# Patient Record
Sex: Female | Born: 1985 | Race: Black or African American | Hispanic: No | Marital: Single | Smoking: Current every day smoker
Health system: Southern US, Community
[De-identification: ages and names within clinical notes are randomized; demographics above are authoritative.]

## PROBLEM LIST (undated history)

## (undated) ENCOUNTER — Inpatient Hospital Stay (HOSPITAL_COMMUNITY): Payer: Self-pay

## (undated) DIAGNOSIS — K219 Gastro-esophageal reflux disease without esophagitis: Secondary | ICD-10-CM

## (undated) DIAGNOSIS — IMO0002 Reserved for concepts with insufficient information to code with codable children: Secondary | ICD-10-CM

## (undated) DIAGNOSIS — R102 Pelvic and perineal pain: Secondary | ICD-10-CM

## (undated) DIAGNOSIS — A549 Gonococcal infection, unspecified: Secondary | ICD-10-CM

## (undated) DIAGNOSIS — R87619 Unspecified abnormal cytological findings in specimens from cervix uteri: Secondary | ICD-10-CM

## (undated) DIAGNOSIS — A749 Chlamydial infection, unspecified: Secondary | ICD-10-CM

## (undated) DIAGNOSIS — N7011 Chronic salpingitis: Secondary | ICD-10-CM

## (undated) DIAGNOSIS — R87629 Unspecified abnormal cytological findings in specimens from vagina: Secondary | ICD-10-CM

## (undated) DIAGNOSIS — D649 Anemia, unspecified: Secondary | ICD-10-CM

## (undated) DIAGNOSIS — I499 Cardiac arrhythmia, unspecified: Secondary | ICD-10-CM

## (undated) HISTORY — DX: Unspecified abnormal cytological findings in specimens from cervix uteri: R87.619

## (undated) HISTORY — DX: Reserved for concepts with insufficient information to code with codable children: IMO0002

---
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
## 2008-07-11 ENCOUNTER — Encounter: Payer: Self-pay | Admitting: Emergency Medicine

## 2008-07-11 ENCOUNTER — Inpatient Hospital Stay (HOSPITAL_COMMUNITY): Admission: AD | Admit: 2008-07-11 | Discharge: 2008-07-13 | Payer: Self-pay | Admitting: Obstetrics and Gynecology

## 2010-05-18 ENCOUNTER — Emergency Department (HOSPITAL_COMMUNITY)
Admission: EM | Admit: 2010-05-18 | Discharge: 2010-05-18 | Disposition: A | Discharge status: Home / Self Care | Payer: Self-pay | Attending: Emergency Medicine | Admitting: Emergency Medicine

## 2010-05-18 DIAGNOSIS — R42 Dizziness and giddiness: Secondary | ICD-10-CM | POA: Insufficient documentation

## 2010-05-18 DIAGNOSIS — R5381 Other malaise: Secondary | ICD-10-CM | POA: Insufficient documentation

## 2010-05-18 LAB — URINALYSIS, ROUTINE W REFLEX MICROSCOPIC
Glucose, UA: NEGATIVE mg/dL
Leukocytes, UA: NEGATIVE
Protein, ur: 30 mg/dL — AB
Specific Gravity, Urine: 1.029 (ref 1.005–1.030)
Urobilinogen, UA: 1 mg/dL (ref 0.0–1.0)

## 2010-05-18 LAB — URINE MICROSCOPIC-ADD ON

## 2010-06-05 LAB — DIFFERENTIAL
Eosinophils Absolute: 0 10*3/uL (ref 0.0–0.7)
Lymphocytes Relative: 9 % — ABNORMAL LOW (ref 12–46)
Lymphs Abs: 1.1 10*3/uL (ref 0.7–4.0)
Neutrophils Relative %: 84 % — ABNORMAL HIGH (ref 43–77)

## 2010-06-05 LAB — URINALYSIS, ROUTINE W REFLEX MICROSCOPIC
Bilirubin Urine: NEGATIVE
Glucose, UA: NEGATIVE mg/dL
Ketones, ur: NEGATIVE mg/dL
pH: 6 (ref 5.0–8.0)

## 2010-06-05 LAB — BASIC METABOLIC PANEL
CO2: 23 mEq/L (ref 19–32)
Calcium: 9.3 mg/dL (ref 8.4–10.5)
Chloride: 109 mEq/L (ref 96–112)
GFR calc Af Amer: >60 mL/min (ref >60)
Sodium: 139 mEq/L (ref 135–145)

## 2010-06-05 LAB — CBC
HCT: 27 % — ABNORMAL LOW (ref 36.0–46.0)
MCHC: 34 g/dL (ref 30.0–36.0)
Platelets: 242 10*3/uL (ref 150–400)
Platelets: 217 10*3/uL (ref 150–400)
RDW: 20.6 % — ABNORMAL HIGH (ref 11.5–15.5)
WBC: 12.3 10*3/uL — ABNORMAL HIGH (ref 4.0–10.5)

## 2010-06-05 LAB — URINE MICROSCOPIC-ADD ON

## 2010-06-05 LAB — WET PREP, GENITAL: Trich, Wet Prep: NONE SEEN

## 2010-06-05 LAB — POCT PREGNANCY, URINE: Preg Test, Ur: NEGATIVE

## 2010-06-05 LAB — GC/CHLAMYDIA PROBE AMP, GENITAL: Chlamydia, DNA Probe: POSITIVE — AB

## 2010-06-05 LAB — HIV ANTIBODY (ROUTINE TESTING W REFLEX): HIV: NONREACTIVE

## 2010-07-12 ENCOUNTER — Emergency Department (HOSPITAL_COMMUNITY)
Admission: EM | Admit: 2010-07-12 | Discharge: 2010-07-12 | Disposition: A | Discharge status: Home / Self Care | Payer: Self-pay | Attending: Emergency Medicine | Admitting: Emergency Medicine

## 2010-07-12 DIAGNOSIS — K029 Dental caries, unspecified: Secondary | ICD-10-CM | POA: Insufficient documentation

## 2010-07-12 DIAGNOSIS — K089 Disorder of teeth and supporting structures, unspecified: Secondary | ICD-10-CM | POA: Insufficient documentation

## 2010-07-13 NOTE — Discharge Summary (Signed)
Sherry Hayes, Sherry Hayes                  ACCOUNT NO.:  [id number]   MEDICAL RECORD NO.:  [id number]          PATIENT TYPE:  INP   LOCATION:  9117                          [facility]ILITY:  [facility]   PHYSICIAN:  Conni Elliot, M.D.DATE OF BIRTH:  Nov 10, 1985   DATE OF ADMISSION:  03/06/2004  DATE OF DISCHARGE:  03/12/2004                                 DISCHARGE SUMMARY   ADMISSION DIAGNOSES:  68.  A 25 year old G1 at 34-2/7 weeks with elevated blood pressure.  2.  Proteinuria.  3.  Reassuring fetal heart tracing.   DISCHARGE DIAGNOSES:  77.  A 25 year old, G1, P0-1-0-1, postoperative day #3, status post lower      transverse C-section for breech presentation and severe preeclampsia.  2.  Viable infant, female, with Apgars of 7 and 8.  3.  Elevated blood pressure.   DISCHARGE MEDICATIONS:  1.  Ibuprofen 600 mg one p.o. q.6h. p.r.n.  2.  Percocet 5/325 mg one to two p.o. q.4-6h. p.r.n., #30, no refills.  3.  Ortho-Novum 1/35 one p.o. daily to begin on March 25, 2004.  4.  Prenatal vitamins one p.o. daily x 6 weeks.  5.  Hydrochlorothiazide 25 mg one p.o. daily x 6 weeks.   HISTORY OF PRESENT ILLNESS:  Sherry Hayes was sent from [facility] with  elevated blood pressure and proteinuria.  She was admitted for severe  preeclampsia with blood pressures in the 200s/120s.   HOSPITAL COURSE:  The patient was given a course of steroids and 24 hours  after the second dose the patient was taken for a C-section due to breech  presentation.  Please see the operative note.   Her postoperative course was fairly uncomplicated.  She was placed on  magnesium throughout her hospital stay and this was continued through  postoperative day #2.  On postoperative day #1, her platelet count did trend  down to 117 from 219 and there was some concern with HELLP.  She was not  given steroids.   After discontinuing her magnesium on postoperative day #2, the patient did  well.  She was placed on hydrochlorothiazide for  blood pressures in the  150s/90s.   DISPOSITION:  The patient was discharged to home in stable condition.  Her  infant is in the NICU doing well.  She is bottle feeding.   FOLLOWUP:  The patient was told to follow up at [facility] in six weeks  and to follow the above medical regimen.      LC/MEDQ  D:  03/12/2004  T:  03/12/2004  Job:  [id number]   cc:   [facility]  [employer]

## 2010-07-13 NOTE — Discharge Summary (Signed)
Sherry Hayes, Sherry Hayes                  ACCOUNT NO.:  [id number]   MEDICAL RECORD NO.:  [id number]          PATIENT TYPE:  INP   LOCATION:  9304                          [facility]:  [facility]   PHYSICIAN:  Allie Bossier, MD        DATE OF BIRTH:  May 23, 1985   DATE OF ADMISSION:  07/11/2008  DATE OF DISCHARGE:  07/13/2008                               DISCHARGE SUMMARY   ADMISSION DIAGNOSES:  1. Pelvic pain and bilateral hydrosalpinges.  2. Anemia.   DISCHARGE DIAGNOSES:  1. Pelvic pain and bilateral hydrosalpinges.  2. Anemia.  3. Positive gonorrhea and chlamydia.   CONDITION:  Stable.   DISPOSITION:  Home.   DIET:  As tolerated.   MEDICATIONS:  1. Flagyl 500 mg p.o. b.i.d., #14.  2. Doxycycline 100 mg p.o. b.i.d., #20.   She will follow up in the GYN Clinic in 2-4 weeks or sooner as  necessary.  She was also given an injection of Depo-Provera 150 mg IM  before discharge.   Labs while she was here, her hemoglobin nadir on Jul 12, 2008, at 9.2.  Her hepatitis B, hepatitis C, HIV, and RPR were negative.   HISTORY OF PRESENT ILLNESS AND HOSPITAL COURSE:  Sherry Hayes is a 25 year old  single black gravida 1, para 1 who has been in a relationship with her  current sexual partner for the last couple of years.  She believes they  are monogamous.  She came to the emergency room complaining of pelvic  pain.  Ultrasound showed 2 small bilateral hydrosalpinges.  She was  afebrile in the emergency room and throughout her hospital course her  white count was 12.  She was admitted and started on Flagyl, Ceftin, and  doxycycline.  Cultures came back the following  day, as positive and I discussed with her this positive result and her  need to have her boyfriend treated and to not have sex with him until  she is treated.  She was discharged home on Jul 13, 2008, in stable  condition.  She said her pain was resolved.  She will follow up as  above.      Allie Bossier, MD  Electronically Signed     MCD/MEDQ  D:  08/05/2008  T:  08/06/2008  Job:  [id number]

## 2010-07-13 NOTE — Op Note (Signed)
NAME:  Sherry Hayes, Sherry Hayes                  ACCOUNT NO.:  [id number]   MEDICAL RECORD NO.:  [id number]          PATIENT TYPE:  INP   LOCATION:  9372                          [facility]ILITY:  [facility]   PHYSICIAN:  Phil D. Okey Dupre, M.D.     DATE OF BIRTH:  12/07/1985   DATE OF PROCEDURE:  03/09/2004  DATE OF DISCHARGE:                                 OPERATIVE REPORT   PREOPERATIVE DIAGNOSES:  1.  32 and 1 week intrauterine pregnancy.  2.  Severe preeclampsia.  3.  Breech presentation.   POSTOPERATIVE DIAGNOSES:  1.  32 and 1 week intrauterine pregnancy.  2.  Severe preeclampsia.  3.  Breech presentation.   PROCEDURE:  Low transverse C section.   SURGEON:  Javier Glazier. Okey Dupre, M.D.   ASSISTANT:  Jon Gills, M.D.   ANESTHESIA:  Spinal.   ESTIMATED BLOOD LOSS:  800 mL.   IV FLUIDS:  300 mL.   URINE OUTPUT:  150 mL of clear urine.   COMPLICATIONS:  None.   INDICATIONS FOR PROCEDURE:  A 25 year old, G1 at 32 weeks with severe  preeclampsia by blood pressure criteria with the fetus in a breech  presentation.   FINDINGS:  Viable female infant in the breech presentation. Clear fluid.  Apgar's 7 and 8. pH 7.38, weight is pending, normal uterus, tubes and  ovaries.   DESCRIPTION OF PROCEDURE:  The patient was taken to the operating room where  spinal anesthesia was found to be adequate. She was prepped and draped in  the normal sterile fashion in dorsal supine position. A Pfannenstiel skin  incision was made with the scalpel and carried through the underlying layer  of fascia. The fascia was incised in the midline and incision was extended  laterally with Mayo scissors. The inferior aspect of the fascial incision  was grasped with Kocher's, elevated and the underlying rectus muscles were  dissected off bluntly. Attention was turned to the superior aspect of the  incision and in a similar fashion was grasped, tented up with Kocher's and  the rectus muscles dissected off bluntly.  The rectus muscles  were then  separated in the midline, the peritoneum was identified, tented up and  entered sharply with Metzenbaum scissors. The peritoneal incision was  extended superiorly and inferiorly with good visualization of the bladder.  The bladder blade was inserted and the vesicouterine peritoneum was  identified, grasped with Christella Hartigan and entered sharply with Metzenbaum  scissors. The incision was extended laterally and the bladder flap was  created digitally.   The bladder blade was reinserted and the lower uterine segment was incised  in a transverse fashion with a scalpel. The uterine incision was extended  bluntly.  The bladder blade was removed and the head was then delivered  atraumatically. The nose and mouth were suctioned with the bulb and the cord  was clamped and cut. The infant was handed off to the waiting NICU team in  attendance. Cord gases and cord blood were sent.   The placenta was removed manually and the uterus was cleared of all clots  and  debris. The uterine incision was repaired with 1-0 Vicryl in a running  locked fashion. A second layer of the same suture was used to obtain  excellent hemostasis. The gutters were cleared of all clots. The fascia was  reapproximated with #0 Vicryl in a running fashion. The skin was closed with  staples.   The patient tolerated the procedure well. Sponge, lap and needle counts were  correct x2.  Two grams of Ancef were given at cord clamp. The patient was  taken to the recovery room in stable condition.      LC/MEDQ  D:  03/09/2004  T:  03/09/2004  Job:  [id number]

## 2010-12-06 ENCOUNTER — Emergency Department (HOSPITAL_COMMUNITY)
Admission: EM | Admit: 2010-12-06 | Discharge: 2010-12-06 | Disposition: A | Discharge status: Home / Self Care | Payer: Self-pay | Attending: Emergency Medicine | Admitting: Emergency Medicine

## 2010-12-06 DIAGNOSIS — K029 Dental caries, unspecified: Secondary | ICD-10-CM | POA: Insufficient documentation

## 2010-12-06 DIAGNOSIS — K089 Disorder of teeth and supporting structures, unspecified: Secondary | ICD-10-CM | POA: Insufficient documentation

## 2011-06-12 ENCOUNTER — Encounter (HOSPITAL_COMMUNITY): Payer: Self-pay | Admitting: Emergency Medicine

## 2011-06-12 ENCOUNTER — Emergency Department (INDEPENDENT_AMBULATORY_CARE_PROVIDER_SITE_OTHER)
Admission: EM | Admit: 2011-06-12 | Discharge: 2011-06-12 | Disposition: A | Discharge status: Home / Self Care | Payer: Medicaid Other | Source: Home / Self Care | Attending: Emergency Medicine | Admitting: Emergency Medicine

## 2011-06-12 DIAGNOSIS — R42 Dizziness and giddiness: Secondary | ICD-10-CM

## 2011-06-12 HISTORY — DX: Gonococcal infection, unspecified: A54.9

## 2011-06-12 HISTORY — DX: Anemia, unspecified: D64.9

## 2011-06-12 HISTORY — DX: Pelvic and perineal pain: R10.2

## 2011-06-12 HISTORY — DX: Chronic salpingitis: N70.11

## 2011-06-12 HISTORY — DX: Chlamydial infection, unspecified: A74.9

## 2011-06-12 LAB — CBC
HCT: 31.8 % — ABNORMAL LOW (ref 36.0–46.0)
Hemoglobin: 10.4 g/dL — ABNORMAL LOW (ref 12.0–15.0)
MCH: 25.8 pg — ABNORMAL LOW (ref 26.0–34.0)
MCHC: 32.7 g/dL (ref 30.0–36.0)
MCV: 78.9 fL (ref 78.0–100.0)

## 2011-06-12 LAB — POCT PREGNANCY, URINE: Preg Test, Ur: NEGATIVE

## 2011-06-12 LAB — POCT I-STAT, CHEM 8
Calcium, Ion: 1.24 mmol/L (ref 1.12–1.32)
HCT: 35 % — ABNORMAL LOW (ref 36.0–46.0)
Hemoglobin: 11.9 g/dL — ABNORMAL LOW (ref 12.0–15.0)
Sodium: 142 mEq/L (ref 135–145)
TCO2: 24 mmol/L (ref 0–100)

## 2011-06-12 LAB — POCT URINALYSIS DIP (DEVICE)
Leukocytes, UA: NEGATIVE
Nitrite: NEGATIVE
Protein, ur: 30 mg/dL — AB
Urobilinogen, UA: 0.2 mg/dL (ref 0.0–1.0)
pH: 6 (ref 5.0–8.0)

## 2011-06-12 NOTE — ED Notes (Signed)
States this is her 3rd day of menses, and is having heavy bleeding; has felt as if she is about to faint and has been loosing her vision; while on her way to work, she had to turn around and go back home , since she thought she was going to pass out if she continued to drive to work ( drove herself home w/o pulling over to try to recover)

## 2011-06-12 NOTE — ED Provider Notes (Signed)
History     CSN: [id number]  Arrival date & time 06/12/11  1543   First MD Initiated Contact with Patient 06/12/11 1604      Chief Complaint  Patient presents with  . Dizziness    (Consider location/radiation/quality/duration/timing/severity/associated sxs/prior treatment) HPI Comments: Patient reports an episode of "blurry vision" with palpitations while driving earlier today. This lasted several minutes and then resolve. Has not had any further episodes. There no aggravating or alleviating factors. She's not tried anything for her symptoms. No chest pain, shortness of breath, nausea, vomiting, diaphoresis. No syncope. No headache, photophobia, other visual changes. No abdominal pain, urinary complaints. Patient states she is currently having menses, which is heavier than usual. States that she is eating and drinking well. Patient states that she's under a significant amount of stress, and is not getting more than 3 or 4 hours of sleep at night, she is working 2 jobs and taking care of a young child. States she drink coffee, but denies any excessive amount. states she does not drink energy drinks on a regular basis, last one was 2 days ago. Denies any medications or illegal drugs. No family history of fatal arrhythmias, MI, stroke.    ROS as noted in HPI. All other ROS negative.   Patient is a 26 y.o. female presenting with neurologic complaint. The history is provided by the patient. No language interpreter was used.  Neurologic Problem The primary symptoms include dizziness. The symptoms began 2 to 6 hours ago. The symptoms are resolved.    Past Medical History  Diagnosis Date  . Gonorrhea   . Chlamydia   . Anemia   . Pelvic pain   . Hydrosalpinx     Past Surgical History  Procedure Date  . Cesarean section     History reviewed. No pertinent family history.  History  Substance Use Topics  . Smoking status: Never Smoker   . Smokeless tobacco: Not on file  . Alcohol  Use: No    OB History    Grav Para Term Preterm Abortions TAB SAB Ect Mult Living                  Review of Systems  Neurological: Positive for dizziness.    Allergies  Review of patient's allergies indicates no known allergies.  Home Medications  No current outpatient prescriptions on file.  BP 147/93  Pulse 96  Temp(Src) 99.1 F (37.3 C) (Oral)  Resp 18  SpO2 100%  LMP 06/12/2011  Filed Vitals:   06/12/11 1658 06/12/11 1807 06/12/11 1808 06/12/11 1809  BP: 126/72 150/90 145/87 147/93  Pulse: 87 96 90 96  Temp: 99.1 F (37.3 C)     TempSrc: Oral     Resp: 18     SpO2: 100%        Physical Exam  Nursing note and vitals reviewed. Constitutional: She is oriented to person, place, and time. She appears well-developed and well-nourished. No distress.  HENT:  Head: Normocephalic and atraumatic.  Right Ear: Tympanic membrane normal.  Left Ear: Tympanic membrane normal.  Nose: Nose normal. Right sinus exhibits no maxillary sinus tenderness and no frontal sinus tenderness. Left sinus exhibits no maxillary sinus tenderness and no frontal sinus tenderness.  Mouth/Throat: Uvula is midline, oropharynx is clear and moist and mucous membranes are normal.  Eyes: EOM and lids are normal. Pupils are equal, round, and reactive to light.       Pale conjunctiva  Neck: Normal range of motion and  full passive range of motion without pain. Neck supple. Carotid bruit is not present.  Cardiovascular: Normal rate, regular rhythm and normal heart sounds.   Pulmonary/Chest: Effort normal and breath sounds normal.  Abdominal: Soft. Bowel sounds are normal. She exhibits no distension.  Musculoskeletal: Normal range of motion.  Lymphadenopathy:    She has no cervical adenopathy.  Neurological: She is alert and oriented to person, place, and time. She has normal strength. She displays no tremor. No cranial nerve deficit or sensory deficit. Coordination and gait normal.       Finger->nose,  heel-> shin WNL. Tandem gait steady.   Skin: Skin is warm and dry.  Psychiatric: She has a normal mood and affect. Her behavior is normal. Judgment and thought content normal.    ED Course  Procedures (including critical care time)  Labs Reviewed  CBC - Abnormal; Notable for the following:    Hemoglobin 10.4 (*)    HCT 31.8 (*)    MCH 25.8 (*)    RDW 15.6 (*)    All other components within normal limits  POCT URINALYSIS DIP (DEVICE) - Abnormal; Notable for the following:    Ketones, ur TRACE (*)    Hgb urine dipstick LARGE (*)    Protein, ur 30 (*)    All other components within normal limits  POCT I-STAT, CHEM 8 - Abnormal; Notable for the following:    Hemoglobin 11.9 (*)    HCT 35.0 (*)    All other components within normal limits  POCT PREGNANCY, URINE   No results found.   1. Dizziness     Results for orders placed during the hospital encounter of 06/12/11  CBC      Component Value Range   WBC 5.8  4.0 - 10.5 (K/uL)   RBC 4.03  3.87 - 5.11 (MIL/uL)   Hemoglobin 10.4 (*) 12.0 - 15.0 (g/dL)   HCT 16.1 (*) 09.6 - 46.0 (%)   MCV 78.9  78.0 - 100.0 (fL)   MCH 25.8 (*) 26.0 - 34.0 (pg)   MCHC 32.7  30.0 - 36.0 (g/dL)   RDW 04.5 (*) 40.9 - 15.5 (%)   Platelets 195  150 - 400 (K/uL)  POCT URINALYSIS DIP (DEVICE)      Component Value Range   Glucose, UA NEGATIVE  NEGATIVE (mg/dL)   Bilirubin Urine NEGATIVE  NEGATIVE    Ketones, ur TRACE (*) NEGATIVE (mg/dL)   Specific Gravity, Urine >=1.030  1.005 - 1.030    Hgb urine dipstick LARGE (*) NEGATIVE    pH 6.0  5.0 - 8.0    Protein, ur 30 (*) NEGATIVE (mg/dL)   Urobilinogen, UA 0.2  0.0 - 1.0 (mg/dL)   Nitrite NEGATIVE  NEGATIVE    Leukocytes, UA NEGATIVE  NEGATIVE   POCT I-STAT, CHEM 8      Component Value Range   Sodium 142  135 - 145 (mEq/L)   Potassium 3.6  3.5 - 5.1 (mEq/L)   Chloride 107  96 - 112 (mEq/L)   BUN 10  6 - 23 (mg/dL)   Creatinine, Ser 8.11  0.50 - 1.10 (mg/dL)   Glucose, Bld 81  70 - 99 (mg/dL)    Calcium, Ion 9.14  1.12 - 1.32 (mmol/L)   TCO2 24  0 - 100 (mmol/L)   Hemoglobin 11.9 (*) 12.0 - 15.0 (g/dL)   HCT 78.2 (*) 95.6 - 46.0 (%)  POCT PREGNANCY, URINE      Component Value Range   Preg Test,  Ur NEGATIVE  NEGATIVE      MDM  Previous records reviewed. Additional medical history obtained.  Patient has slightly pale, and is currently having heavy menstrual bleeding. Checking CBC. Last H&H 9.2/27.  Also checking i-STAT to evaluate electrolytes, kidney function. Doing orthostatics, UA and EKG. Exam otherwise unremarkable. Patient denies illegal drug use. Workup is negative, will have her followup with her primary care physician for reevaluation and possible outpatient Holter monitoring. Referring her to cardiology.  Urine concentrated. Patient is not orthostatic. Today's hemoglobin 10.4. improved from previous hemoglobin of 9.2   EKG: Normal sinus rhythm, rate 92. Normal axis, normal intervals, no hypertrophy. no ST T-wave changes. No previous EKG for comparison.   Luiz Blare, MD 06/12/11 2228

## 2011-06-12 NOTE — ED Provider Notes (Signed)
History     CSN: [id number]  Arrival date & time 06/12/11  1543   First MD Initiated Contact with Patient 06/12/11 1604      Chief Complaint  Patient presents with  . Dizziness    (Consider location/radiation/quality/duration/timing/severity/associated sxs/prior treatment) HPI  Past Medical History  Diagnosis Date  . Gonorrhea   . Chlamydia   . Anemia   . Pelvic pain   . Hydrosalpinx     Past Surgical History  Procedure Date  . Cesarean section     History reviewed. No pertinent family history.  History  Substance Use Topics  . Smoking status: Never Smoker   . Smokeless tobacco: Not on file  . Alcohol Use: No    OB History    Grav Para Term Preterm Abortions TAB SAB Ect Mult Living                  Review of Systems  Allergies  Review of patient's allergies indicates no known allergies.  Home Medications  No current outpatient prescriptions on file.  BP 147/93  Pulse 96  Temp(Src) 99.1 F (37.3 C) (Oral)  Resp 18  SpO2 100%  LMP 06/12/2011  Physical Exam  ED Course  Procedures (including critical care time)  Labs Reviewed  CBC - Abnormal; Notable for the following:    Hemoglobin 10.4 (*)    HCT 31.8 (*)    MCH 25.8 (*)    RDW 15.6 (*)    All other components within normal limits  POCT URINALYSIS DIP (DEVICE) - Abnormal; Notable for the following:    Ketones, ur TRACE (*)    Hgb urine dipstick LARGE (*)    Protein, ur 30 (*)    All other components within normal limits  POCT I-STAT, CHEM 8 - Abnormal; Notable for the following:    Hemoglobin 11.9 (*)    HCT 35.0 (*)    All other components within normal limits  POCT PREGNANCY, URINE   No results found.   1. Dizziness       MDM          Linna Hoff, MD 06/19/11 2046

## 2011-06-12 NOTE — Discharge Instructions (Signed)
You need to drink at least 2 L of non-caffeinated, non-sugary fluids a day. Herbal teas, flavored water, are good choices. Try to start getting regular sleep. Use a dark eye mask if you need to sleep during the day. You may also want to try some soothing music on headphones or something that has white noise. Lookup "sleep therapy" on itunes.you can try Benadryl 20-50 mg, herbal teas. Regular exercise will also help you sleep better. Do not watch TV or use of electrical devices for an hour before going to bed. You will need to see a cardiologist if this happens on a repeated basis. Return if you passout, have chest pain, if you get worse, or for any other concerns.

## 2011-08-21 ENCOUNTER — Encounter: Payer: Self-pay | Admitting: Physician Assistant

## 2011-09-02 ENCOUNTER — Encounter: Payer: Medicaid Other | Admitting: Obstetrics & Gynecology

## 2011-09-26 ENCOUNTER — Encounter: Payer: Self-pay | Admitting: Physician Assistant

## 2011-09-26 ENCOUNTER — Other Ambulatory Visit (HOSPITAL_COMMUNITY)
Admission: RE | Admit: 2011-09-26 | Discharge: 2011-09-26 | Disposition: A | Discharge status: Home / Self Care | Payer: Self-pay | Source: Ambulatory Visit | Attending: Physician Assistant | Admitting: Physician Assistant

## 2011-09-26 ENCOUNTER — Ambulatory Visit (INDEPENDENT_AMBULATORY_CARE_PROVIDER_SITE_OTHER): Payer: Self-pay | Admitting: Physician Assistant

## 2011-09-26 VITALS — BP 144/85 | HR 124 | Temp 99.3°F | Ht 63.0 in | Wt 151.0 lb

## 2011-09-26 DIAGNOSIS — N7013 Chronic salpingitis and oophoritis: Secondary | ICD-10-CM

## 2011-09-26 DIAGNOSIS — N7011 Chronic salpingitis: Secondary | ICD-10-CM

## 2011-09-26 DIAGNOSIS — Z01812 Encounter for preprocedural laboratory examination: Secondary | ICD-10-CM

## 2011-09-26 DIAGNOSIS — R87811 Vaginal high risk human papillomavirus (HPV) DNA test positive: Secondary | ICD-10-CM

## 2011-09-26 DIAGNOSIS — IMO0002 Reserved for concepts with insufficient information to code with codable children: Secondary | ICD-10-CM | POA: Insufficient documentation

## 2011-09-26 DIAGNOSIS — R87619 Unspecified abnormal cytological findings in specimens from cervix uteri: Secondary | ICD-10-CM | POA: Insufficient documentation

## 2011-09-26 HISTORY — DX: Reserved for concepts with insufficient information to code with codable children: IMO0002

## 2011-09-26 LAB — POCT PREGNANCY, URINE: Preg Test, Ur: NEGATIVE

## 2011-09-26 NOTE — Patient Instructions (Signed)
Colposcopy Care After Colposcopy is a procedure in which a special tool is used to magnify the surface of the cervix. A tissue sample (biopsy) may also be taken. This sample will be looked at for cervical cancer or other problems. After the test:  You may have some cramping.   Lie down for a few minutes if you feel lightheaded.    You may have some bleeding which should stop in a few days.  HOME CARE  Do not have sex or use tampons for 2 to 3 days or as told.   Only take medicine as told by your doctor.   Continue to take your birth control pills as usual.  Finding out the results of your test Ask when your test results will be ready. Make sure you get your test results. GET HELP RIGHT AWAY IF:  You are bleeding a lot or are passing blood clots.   You develop a fever of 102 F (38.9 C) or higher.   You have abnormal vaginal discharge.   You have cramps that do not go away with medicine.   You feel lightheaded, dizzy, or pass out (faint).  MAKE SURE YOU:   Understand these instructions.   Will watch your condition.   Will get help right away if you are not doing well or get worse.  Document Released: 07/31/2007 Document Revised: 01/31/2011 Document Reviewed: 07/31/2007 [employer] Patient Information 2012 [employer]. 

## 2011-09-26 NOTE — Progress Notes (Signed)
Chief Complaint:  Colposcopy   Sherry Hayes is  26 y.o. [phone].  Patient's last menstrual period was 09/02/2011..  She presents complaining of Colposcopy  Pt referred from [facility] secondary to [facility]UC + HPV pap. Pt complains of "knot" on left side of c/s scar. States present since 2010. She is concerned due to being told it was in the abd pain.  Review of 2010 pelvic US showns BL hydrosalpinx with small amount of free fluid with no mention of abd wall defect.  Obstetrical/Gynecological History: OB History    Grav Para Term Preterm Abortions TAB SAB Ect Mult Living   2 1 0 1 1  1   1       Past Medical History: Past Medical History  Diagnosis Date  . Gonorrhea   . Chlamydia   . Anemia   . Pelvic pain   . Hydrosalpinx   . Abnormal Pap smear     ASCUS +HPV  . ASCUS with positive high risk HPV 09/26/2011    Colpo: 09/26/2011    Past Surgical History: Past Surgical History  Procedure Date  . Cesarean section     Family History: History reviewed. No pertinent family history.  Social History: History  Substance Use Topics  . Smoking status: Current Everyday Smoker    Types: Cigarettes  . Smokeless tobacco: Never Used  . Alcohol Use: Yes    Allergies: No Known Allergies   Review of Systems - Negative except what is reviewed in HPI  Physical Exam   Blood pressure 144/85, pulse 124, temperature 99.3 F (37.4 C), temperature source Oral, height 5\' 3"  (1.6 m), weight 151 lb (68.493 kg), last menstrual period 09/02/2011.  General: General appearance - alert, well appearing, and in no distress, oriented to person, place, and time and anxious Mental status - alert, oriented to person, place, and time, anxious Abdomen - soft, nontender, nondistended, no masses or organomegaly scars from previous incisions noted, pfannenstiel scar well healed, ~ 1cm hardening noted on left side of scar, non tender  Focused Gynecological Exam: VULVA: normal appearing vulva with no masses, tenderness  or lesions, VAGINA: normal appearing vagina with normal color and discharge, no lesions, CERVIX: normal appearing cervix without discharge or lesions, UTERUS: uterus is normal size, shape, consistency and nontender, ADNEXA: normal adnexa in size, nontender and no masses  Patient given informed consent, signed copy in the chart, time out was performed.  Placed in lithotomy position. Cervix viewed with speculum and colposcope after application of acetic acid.   Colposcopy adequate?  No Acetowhite lesions?No Punctation?No Mosaicism?  No Abnormal vasculature?  No Biopsies?No ECC?Yes  COMMENTS:CIN I Patient was given post procedure instructions.  She will return in 2 weeks for results.   Labs: Recent Results (from the past 24 hour(s))  POCT PREGNANCY, URINE   Collection Time   09/26/11  2:00 PM      Component Value Range   Preg Test, Ur NEGATIVE  NEGATIVE   Imaging Studies:  No results found.   Assessment: Patient Active Problem List  Diagnosis  . ASCUS with positive high risk HPV    Plan: Colposcopy today Repeat pelvic US ordered to re-eval hydrosalpinx  RTC in 3 weeks for review of results  Lynore Coscia E. 09/26/2011,2:34 PM

## 2011-09-30 ENCOUNTER — Ambulatory Visit (HOSPITAL_COMMUNITY)
Admission: RE | Admit: 2011-09-30 | Discharge: 2011-09-30 | Disposition: A | Discharge status: Home / Self Care | Payer: Self-pay | Source: Ambulatory Visit | Attending: Physician Assistant | Admitting: Physician Assistant

## 2011-09-30 DIAGNOSIS — N7011 Chronic salpingitis: Secondary | ICD-10-CM

## 2011-09-30 DIAGNOSIS — N949 Unspecified condition associated with female genital organs and menstrual cycle: Secondary | ICD-10-CM | POA: Insufficient documentation

## 2011-09-30 DIAGNOSIS — N7013 Chronic salpingitis and oophoritis: Secondary | ICD-10-CM | POA: Insufficient documentation

## 2011-10-02 ENCOUNTER — Telehealth: Payer: Self-pay

## 2011-10-02 NOTE — Telephone Encounter (Signed)
Pt emergency contact number is a wrong number according to the person who answered they do not know [employer]. Pt contact number continues to give same error message as listed previously.

## 2011-10-02 NOTE — Telephone Encounter (Addendum)
Alternate number found on referral notes from [facility]. Updated in epic. However this number was disconnected.  Called Lorne Skeens and left her a voicemail to see if she had an alternate numbers for patient.

## 2011-10-02 NOTE — Telephone Encounter (Signed)
Spoke with Sherry Hayes she had the same number for the patient as we do. However she did have a number for the patients sister [phone] and she spoke with the sister that said she will tell [name] to call us in the morning. She told her to ask to speak to a nurse.

## 2011-10-02 NOTE — Telephone Encounter (Addendum)
Called pt and was unable to leave voicemail due to message stating " number can not be completed as well, please dial with area code".  Attempt x 4 with area code and was unsuccessful.   Re: Per Maylon Cos, pt also needs to be told- Normal pelvic US, hydrosalpinx resolved. Along with what is stated below.

## 2011-10-02 NOTE — Telephone Encounter (Addendum)
Message copied by Faythe Casa on Wed Oct 02, 2011  8:58 AM ------      Message from: August Luz      Created: Tue Oct 01, 2011  7:30 PM       Neg ECC. Will repeat pap with co-testing in 12 months. Please call pt with results. thx

## 2011-10-04 ENCOUNTER — Encounter: Payer: Self-pay | Admitting: *Deleted

## 2011-10-04 NOTE — Telephone Encounter (Signed)
Pt not returning calls. Will send a letter to patient.

## 2011-10-31 ENCOUNTER — Encounter: Payer: Self-pay | Admitting: Obstetrics & Gynecology

## 2011-11-28 ENCOUNTER — Ambulatory Visit: Payer: Self-pay | Admitting: Obstetrics & Gynecology

## 2011-12-02 ENCOUNTER — Encounter (INDEPENDENT_AMBULATORY_CARE_PROVIDER_SITE_OTHER): Payer: Self-pay | Admitting: Surgery

## 2011-12-05 ENCOUNTER — Encounter (INDEPENDENT_AMBULATORY_CARE_PROVIDER_SITE_OTHER): Payer: Self-pay

## 2011-12-05 ENCOUNTER — Ambulatory Visit (INDEPENDENT_AMBULATORY_CARE_PROVIDER_SITE_OTHER): Payer: Self-pay | Admitting: Surgery

## 2011-12-26 ENCOUNTER — Ambulatory Visit (INDEPENDENT_AMBULATORY_CARE_PROVIDER_SITE_OTHER): Payer: Self-pay | Admitting: Surgery

## 2012-02-07 ENCOUNTER — Ambulatory Visit (INDEPENDENT_AMBULATORY_CARE_PROVIDER_SITE_OTHER): Payer: Medicaid Other | Admitting: Surgery

## 2012-02-07 ENCOUNTER — Encounter (INDEPENDENT_AMBULATORY_CARE_PROVIDER_SITE_OTHER): Payer: Self-pay | Admitting: Surgery

## 2012-02-07 VITALS — BP 123/80 | HR 70 | Temp 98.4°F | Resp 12 | Ht 63.0 in | Wt 152.0 lb

## 2012-02-07 DIAGNOSIS — N80129 Deep endometriosis of ovary, unspecified ovary: Secondary | ICD-10-CM

## 2012-02-07 DIAGNOSIS — N809 Endometriosis, unspecified: Secondary | ICD-10-CM

## 2012-02-07 NOTE — Progress Notes (Signed)
Patient ID: Sherry Hayes, female   DOB: 30-Nov-1985, 26 y.o.   MRN: [id number]  Chief Complaint  Patient presents with  . Other    abdominal wall mass    HPI Sherry Hayes is a 26 y.o. female.   HPI This is a pleasant female referred by the health department and Lorne Skeens, RN for evaluation of abdominal wall mass. This mass has been at her lower C-section incision for many years. It is getting larger and causing her increased discomfort especially around her cycles. The pain is sharp and focal and is now referred anywhere else. She has had a previous ultrasound in 2010 demonstrating a mass which was consistent with an endometrial implant Past Medical History  Diagnosis Date  . Gonorrhea   . Chlamydia   . Anemia   . Pelvic pain   . Hydrosalpinx   . Abnormal Pap smear     ASCUS +HPV  . ASCUS with positive high risk HPV 09/26/2011    Colpo: 09/26/2011    Past Surgical History  Procedure Date  . Cesarean section     History reviewed. No pertinent family history.  Social History History  Substance Use Topics  . Smoking status: Current Every Day Smoker -- 0.2 packs/day    Types: Cigarettes  . Smokeless tobacco: Never Used  . Alcohol Use: No    No Known Allergies  Current Outpatient Prescriptions  Medication Sig Dispense Refill  . Multiple Vitamins-Minerals (MULTIVITAMIN WITH MINERALS) tablet Take 1 tablet by mouth daily.        Review of Systems Review of Systems  Constitutional: Negative for fever, chills and unexpected weight change.  HENT: Negative for hearing loss, congestion, sore throat, trouble swallowing and voice change.   Eyes: Negative for visual disturbance.  Respiratory: Negative for cough and wheezing.   Cardiovascular: Negative for chest pain, palpitations and leg swelling.  Gastrointestinal: Positive for abdominal pain. Negative for nausea, vomiting, diarrhea, constipation, blood in stool, abdominal distention and anal bleeding.  Genitourinary: Negative for  hematuria, vaginal bleeding and difficulty urinating.  Musculoskeletal: Negative for arthralgias.  Skin: Negative for rash and wound.  Neurological: Negative for seizures, syncope and headaches.  Hematological: Negative for adenopathy. Does not bruise/bleed easily.  Psychiatric/Behavioral: Negative for confusion.    Blood pressure 123/80, pulse 70, temperature 98.4 F (36.9 C), temperature source Temporal, resp. rate 12, height 5\' 3"  (1.6 m), weight 152 lb (68.947 kg).  Physical Exam Physical Exam  Constitutional: She is oriented to person, place, and time. She appears well-developed and well-nourished. No distress.  HENT:  Head: Normocephalic and atraumatic.  Right Ear: External ear normal.  Left Ear: External ear normal.  Nose: Nose normal.  Mouth/Throat: Oropharynx is clear and moist. No oropharyngeal exudate.  Eyes: Conjunctivae normal are normal. Pupils are equal, round, and reactive to light. Right eye exhibits no discharge. Left eye exhibits no discharge. No scleral icterus.  Neck: Normal range of motion. Neck supple. No tracheal deviation present. No thyromegaly present.  Cardiovascular: Normal rate, regular rhythm and intact distal pulses.   Murmur heard. Pulmonary/Chest: Effort normal and breath sounds normal. No respiratory distress. She has no wheezes. She has no rales.  Abdominal: Soft. Bowel sounds are normal. She exhibits mass. She exhibits no distension. There is tenderness.       There is a firm, fairly fixed 5 cm abdominal wall mass along her old C-section incision to the left of the midline  Musculoskeletal: Normal range of motion. She exhibits no edema and  no tenderness.  Lymphadenopathy:    She has no cervical adenopathy.    She has no axillary adenopathy.       Right: No inguinal adenopathy present.       Left: No inguinal adenopathy present.  Neurological: She is alert and oriented to person, place, and time.  Skin: Skin is warm and dry. No rash noted. She is  not diaphoretic. No erythema.  Psychiatric: Her behavior is normal. Judgment normal.    Data Reviewed   Assessment    Abdominal wall mass, probable endometrioma    Plan    I explained the diagnosis to her in detail. Surgical excision is recommended to rule out malignancy and relieve her symptoms. I discussed this with her in detail. I discussed the risk of surgery which includes but is not limited to bleeding, infection, injury to shredding structures, recurrence, etc. She understands and wishes to proceed. Surgery will be scheduled. Likelihood of success is good.       Ryn Peine A 02/07/2012, 2:00 PM

## 2012-02-28 ENCOUNTER — Inpatient Hospital Stay (HOSPITAL_COMMUNITY): Admission: RE | Admit: 2012-02-28 | Discharge: 2012-02-28 | Payer: Self-pay | Source: Ambulatory Visit

## 2012-02-28 ENCOUNTER — Other Ambulatory Visit (HOSPITAL_COMMUNITY): Payer: Self-pay | Admitting: *Deleted

## 2012-02-28 NOTE — Progress Notes (Signed)
EKG  4/13 EPIC 

## 2012-02-28 NOTE — Patient Instructions (Addendum)
20 Witney Huie  02/28/2012 THURSDAY  Your procedure is scheduled on:  03/13/11  Report to [facility] at  1130     AM.  Call this number if you have problems the morning of surgery: [phone]       Remember:   Do not eat foodafter midnight Wednesday NIGHT------MAY HAVE CLEAR LIQUIDS (NO MILK PRODUCTS)  UNTIL 0800 Thursday MORNING  THEN NOTHING IN MOUTH   Take these medicines the morning of surgery with A SIP OF WATER:   .  Contacts, dentures or partial plates can not be worn to surgery  Leave suitcase in the car. After surgery it may be brought to your room.  For patients admitted to the hospital, checkout time is 11:00 AM day of  discharge.             SPECIAL INSTRUCTIONS- SEE [facility] PREPARING FOR SURGERY INSTRUCTION SHEET-     DO NOT WEAR JEWELRY, LOTIONS, POWDERS, OR PERFUMES.  WOMEN-- DO NOT SHAVE LEGS OR UNDERARMS FOR 12 HOURS BEFORE SHOWERS. MEN MAY SHAVE FACE.  Patients discharged the day of surgery will not be allowed to drive home. IF going home the day of surgery, you must have a driver and someone to stay with you for the first 24 hours  Name and phone number of your driver:                                                                        Please read over the following fact sheets that you were given: MRSA Information, Incentive Spirometry Sheet, Blood Transfusion Sheet  Information                                                                                   Aric Jost  PST 336  [phone]                 FAILURE TO FOLLOW THESE INSTRUCTIONS MAY RESULT IN  CANCELLATION   OF YOUR SURGERY                                                  Patient Signature _____________________________

## 2012-03-04 NOTE — Patient Instructions (Addendum)
20 Sherry Hayes  03/04/2012   Your procedure is scheduled on:  03/12/12  THURSDAY Report to [facility] at  1130     AM.  Call this number if you have problems the morning of surgery: [phone]       Remember:   Do not eat food  Or drink :After Midnight. Wednesday NIGHT   Take these medicines the morning of surgery with A SIP OF WATER:none   .  Contacts, dentures or partial plates can not be worn to surgery  Leave suitcase in the car. After surgery it may be brought to your room.  For patients admitted to the hospital, checkout time is 11:00 AM day of  discharge.             SPECIAL INSTRUCTIONS- SEE [facility] PREPARING FOR SURGERY INSTRUCTION SHEET-     DO NOT WEAR JEWELRY, LOTIONS, POWDERS, OR PERFUMES.  WOMEN-- DO NOT SHAVE LEGS OR UNDERARMS FOR 12 HOURS BEFORE SHOWERS. MEN MAY SHAVE FACE.  Patients discharged the day of surgery will not be allowed to drive home. IF going home the day of surgery, you must have a driver and someone to stay with you for the first 24 hours  Name and phone number of your driver:    Mom  Dorette Grate                                                                    Please read over the following fact sheets that you were given: MRSA Information, Incentive Spirometry Sheet, Blood Transfusion Sheet  Information                                                                                   [name]  PST 336  [phone]                 FAILURE TO FOLLOW THESE INSTRUCTIONS MAY RESULT IN  CANCELLATION   OF YOUR SURGERY                                                  Patient Signature _____________________________

## 2012-03-05 ENCOUNTER — Encounter (HOSPITAL_COMMUNITY): Payer: Self-pay

## 2012-03-05 ENCOUNTER — Encounter (HOSPITAL_COMMUNITY): Payer: Self-pay | Admitting: Pharmacy Technician

## 2012-03-05 ENCOUNTER — Encounter (HOSPITAL_COMMUNITY)
Admission: RE | Admit: 2012-03-05 | Discharge: 2012-03-05 | Disposition: A | Discharge status: Home / Self Care | Payer: Medicaid Other | Source: Ambulatory Visit | Attending: Surgery | Admitting: Surgery

## 2012-03-05 HISTORY — DX: Gastro-esophageal reflux disease without esophagitis: K21.9

## 2012-03-05 HISTORY — DX: Cardiac arrhythmia, unspecified: I49.9

## 2012-03-05 LAB — SURGICAL PCR SCREEN: MRSA, PCR: NEGATIVE

## 2012-03-05 LAB — HCG, SERUM, QUALITATIVE: Preg, Serum: NEGATIVE

## 2012-03-05 LAB — CBC
Hemoglobin: 11.6 g/dL — ABNORMAL LOW (ref 12.0–15.0)
MCH: 28.4 pg (ref 26.0–34.0)
MCHC: 33.5 g/dL (ref 30.0–36.0)
Platelets: 313 10*3/uL (ref 150–400)
RDW: 13.9 % (ref 11.5–15.5)

## 2012-03-11 NOTE — H&P (Signed)
Patient ID: Sherry Hayes, female DOB: 1985/11/21, 27 y.o. MRN: [id number]  Chief Complaint   Patient presents with   .  Other     abdominal wall mass    HPI  Sherry Hayes is a 27 y.o. female.  HPI  This is a pleasant female referred by the health department and Lorne Skeens, RN for evaluation of abdominal wall mass. This mass has been at her lower C-section incision for many years. It is getting larger and causing her increased discomfort especially around her cycles. The pain is sharp and focal and is now referred anywhere else. She has had a previous ultrasound in 2010 demonstrating a mass which was consistent with an endometrial implant  Past Medical History   Diagnosis  Date   .  Gonorrhea    .  Chlamydia    .  Anemia    .  Pelvic pain    .  Hydrosalpinx    .  Abnormal Pap smear      ASCUS +HPV   .  ASCUS with positive high risk HPV  09/26/2011     Colpo: 09/26/2011    Past Surgical History   Procedure  Date   .  Cesarean section     History reviewed. No pertinent family history.  Social History  History   Substance Use Topics   .  Smoking status:  Current Every Day Smoker -- 0.2 packs/day     Types:  Cigarettes   .  Smokeless tobacco:  Never Used   .  Alcohol Use:  No    No Known Allergies  Current Outpatient Prescriptions   Medication  Sig  Dispense  Refill   .  Multiple Vitamins-Minerals (MULTIVITAMIN WITH MINERALS) tablet  Take 1 tablet by mouth daily.      Review of Systems  Review of Systems  Constitutional: Negative for fever, chills and unexpected weight change.  HENT: Negative for hearing loss, congestion, sore throat, trouble swallowing and voice change.  Eyes: Negative for visual disturbance.  Respiratory: Negative for cough and wheezing.  Cardiovascular: Negative for chest pain, palpitations and leg swelling.  Gastrointestinal: Positive for abdominal pain. Negative for nausea, vomiting, diarrhea, constipation, blood in stool, abdominal distention and anal  bleeding.  Genitourinary: Negative for hematuria, vaginal bleeding and difficulty urinating.  Musculoskeletal: Negative for arthralgias.  Skin: Negative for rash and wound.  Neurological: Negative for seizures, syncope and headaches.  Hematological: Negative for adenopathy. Does not bruise/bleed easily.  Psychiatric/Behavioral: Negative for confusion.   Blood pressure 123/80, pulse 70, temperature 98.4 F (36.9 C), temperature source Temporal, resp. rate 12, height 5\' 3"  (1.6 m), weight 152 lb (68.947 kg).  Physical Exam  Physical Exam  Constitutional: She is oriented to person, place, and time. She appears well-developed and well-nourished. No distress.  HENT:  Head: Normocephalic and atraumatic.  Right Ear: External ear normal.  Left Ear: External ear normal.  Nose: Nose normal.  Mouth/Throat: Oropharynx is clear and moist. No oropharyngeal exudate.  Eyes: Conjunctivae normal are normal. Pupils are equal, round, and reactive to light. Right eye exhibits no discharge. Left eye exhibits no discharge. No scleral icterus.  Neck: Normal range of motion. Neck supple. No tracheal deviation present. No thyromegaly present.  Cardiovascular: Normal rate, regular rhythm and intact distal pulses.  Murmur heard.  Pulmonary/Chest: Effort normal and breath sounds normal. No respiratory distress. She has no wheezes. She has no rales.  Abdominal: Soft. Bowel sounds are normal. She exhibits mass. She exhibits no distension.  There is tenderness.  There is a firm, fairly fixed 5 cm abdominal wall mass along her old C-section incision to the left of the midline  Musculoskeletal: Normal range of motion. She exhibits no edema and no tenderness.  Lymphadenopathy:  She has no cervical adenopathy.  She has no axillary adenopathy.  Right: No inguinal adenopathy present.  Left: No inguinal adenopathy present.  Neurological: She is alert and oriented to person, place, and time.  Skin: Skin is warm and dry. No  rash noted. She is not diaphoretic. No erythema.  Psychiatric: Her behavior is normal. Judgment normal.   Data Reviewed  Assessment   Abdominal wall mass, probable endometrioma   Plan   I explained the diagnosis to her in detail. Surgical excision is recommended to rule out malignancy and relieve her symptoms. I discussed this with her in detail. I discussed the risk of surgery which includes but is not limited to bleeding, infection, injury to shredding structures, recurrence, etc. She understands and wishes to proceed. Surgery will be scheduled. Likelihood of success is good.   Leanda Padmore A

## 2012-03-12 ENCOUNTER — Encounter (HOSPITAL_COMMUNITY)
Admission: RE | Disposition: A | Discharge status: Home / Self Care | Payer: Self-pay | Source: Ambulatory Visit | Attending: Surgery

## 2012-03-12 ENCOUNTER — Ambulatory Visit (HOSPITAL_COMMUNITY): Payer: Medicaid Other | Admitting: Anesthesiology

## 2012-03-12 ENCOUNTER — Encounter (HOSPITAL_COMMUNITY): Payer: Self-pay | Admitting: Anesthesiology

## 2012-03-12 ENCOUNTER — Ambulatory Visit (HOSPITAL_COMMUNITY)
Admission: RE | Admit: 2012-03-12 | Discharge: 2012-03-12 | Disposition: A | Discharge status: Home / Self Care | Payer: Medicaid Other | Source: Ambulatory Visit | Attending: Surgery | Admitting: Surgery

## 2012-03-12 ENCOUNTER — Encounter (HOSPITAL_COMMUNITY): Payer: Self-pay | Admitting: *Deleted

## 2012-03-12 DIAGNOSIS — F17200 Nicotine dependence, unspecified, uncomplicated: Secondary | ICD-10-CM | POA: Insufficient documentation

## 2012-03-12 DIAGNOSIS — N808 Other endometriosis: Secondary | ICD-10-CM | POA: Insufficient documentation

## 2012-03-12 DIAGNOSIS — N806 Endometriosis in cutaneous scar: Secondary | ICD-10-CM

## 2012-03-12 HISTORY — PX: MASS EXCISION: SHX2000

## 2012-03-12 SURGERY — EXCISION MASS
Anesthesia: General | Site: Abdomen | Wound class: Clean

## 2012-03-12 MED ORDER — LACTATED RINGERS IV SOLN: INTRAVENOUS | Status: DC

## 2012-03-12 MED ORDER — CEFAZOLIN SODIUM-DEXTROSE 2-3 GM-% IV SOLR
INTRAVENOUS | Status: AC
  Filled 2012-03-12: qty 50

## 2012-03-12 MED ORDER — MIDAZOLAM HCL 5 MG/5ML IJ SOLN
INTRAMUSCULAR | Status: DC | PRN
  Administered 2012-03-12: 2 mg via INTRAVENOUS

## 2012-03-12 MED ORDER — ACETAMINOPHEN 650 MG RE SUPP
650.0000 mg | RECTAL | Status: DC | PRN
  Filled 2012-03-12: qty 1

## 2012-03-12 MED ORDER — DEXAMETHASONE SODIUM PHOSPHATE 10 MG/ML IJ SOLN
INTRAMUSCULAR | Status: DC | PRN
  Administered 2012-03-12: 10 mg via INTRAVENOUS

## 2012-03-12 MED ORDER — LIDOCAINE HCL (CARDIAC) 20 MG/ML IV SOLN
INTRAVENOUS | Status: DC | PRN
  Administered 2012-03-12: 100 mg via INTRAVENOUS

## 2012-03-12 MED ORDER — FENTANYL CITRATE 0.05 MG/ML IJ SOLN: 25.0000 ug | INTRAMUSCULAR | Status: DC | PRN

## 2012-03-12 MED ORDER — ACETAMINOPHEN 325 MG PO TABS
650.0000 mg | ORAL_TABLET | ORAL | Status: DC | PRN
  Administered 2012-03-12: 650 mg via ORAL
  Filled 2012-03-12: qty 2

## 2012-03-12 MED ORDER — PROPOFOL 10 MG/ML IV BOLUS
INTRAVENOUS | Status: DC | PRN
  Administered 2012-03-12: 160 mg via INTRAVENOUS

## 2012-03-12 MED ORDER — LACTATED RINGERS IV SOLN
INTRAVENOUS | Status: DC | PRN
  Administered 2012-03-12: 10:00:00 via INTRAVENOUS

## 2012-03-12 MED ORDER — CEFAZOLIN SODIUM-DEXTROSE 2-3 GM-% IV SOLR
2.0000 g | INTRAVENOUS | Status: AC
  Administered 2012-03-12: 2 g via INTRAVENOUS

## 2012-03-12 MED ORDER — HYDROCODONE-ACETAMINOPHEN 5-325 MG PO TABS: 1.0000 | ORAL_TABLET | ORAL | Status: DC | PRN

## 2012-03-12 MED ORDER — OXYCODONE HCL 5 MG PO TABS: 5.0000 mg | ORAL_TABLET | ORAL | Status: DC | PRN

## 2012-03-12 MED ORDER — ONDANSETRON HCL 4 MG/2ML IJ SOLN
INTRAMUSCULAR | Status: DC | PRN
  Administered 2012-03-12: 4 mg via INTRAVENOUS

## 2012-03-12 MED ORDER — FENTANYL CITRATE 0.05 MG/ML IJ SOLN
INTRAMUSCULAR | Status: DC | PRN
  Administered 2012-03-12 (×2): 50 ug via INTRAVENOUS

## 2012-03-12 MED ORDER — BUPIVACAINE-EPINEPHRINE PF 0.5-1:200000 % IJ SOLN
INTRAMUSCULAR | Status: DC | PRN
  Administered 2012-03-12: 20 mL

## 2012-03-12 MED ORDER — BUPIVACAINE-EPINEPHRINE (PF) 0.5% -1:200000 IJ SOLN
INTRAMUSCULAR | Status: AC
  Filled 2012-03-12: qty 10

## 2012-03-12 SURGICAL SUPPLY — 41 items
BENZOIN TINCTURE PRP APPL 2/3 (GAUZE/BANDAGES/DRESSINGS) ×2 IMPLANT
BLADE HEX COATED 2.75 (ELECTRODE) ×2 IMPLANT
BLADE SURG 15 STRL LF DISP TIS (BLADE) ×1 IMPLANT
BLADE SURG 15 STRL SS (BLADE) ×1
BLADE SURG SZ10 CARB STEEL (BLADE) ×2 IMPLANT
CANISTER SUCTION 2500CC (MISCELLANEOUS) ×2 IMPLANT
CHLORAPREP W/TINT 26ML (MISCELLANEOUS) ×2 IMPLANT
CLOTH BEACON ORANGE TIMEOUT ST (SAFETY) ×2 IMPLANT
DECANTER SPIKE VIAL GLASS SM (MISCELLANEOUS) ×2 IMPLANT
DERMABOND ADVANCED (GAUZE/BANDAGES/DRESSINGS)
DERMABOND ADVANCED .7 DNX12 (GAUZE/BANDAGES/DRESSINGS) IMPLANT
DRAIN PENROSE 18X1/2 LTX STRL (DRAIN) ×2 IMPLANT
DRAPE LAPAROTOMY TRNSV 102X78 (DRAPE) ×2 IMPLANT
DRSG TEGADERM 4X4.75 (GAUZE/BANDAGES/DRESSINGS) ×2 IMPLANT
ELECT REM PT RETURN 9FT ADLT (ELECTROSURGICAL) ×2
ELECTRODE REM PT RTRN 9FT ADLT (ELECTROSURGICAL) ×1 IMPLANT
GAUZE SPONGE 4X4 16PLY XRAY LF (GAUZE/BANDAGES/DRESSINGS) IMPLANT
GLOVE SURG SIGNA 7.5 PF LTX (GLOVE) ×4 IMPLANT
GOWN STRL NON-REIN LRG LVL3 (GOWN DISPOSABLE) ×2 IMPLANT
GOWN STRL REIN XL XLG (GOWN DISPOSABLE) ×4 IMPLANT
KIT BASIN OR (CUSTOM PROCEDURE TRAY) ×2 IMPLANT
NEEDLE HYPO 22GX1.5 SAFETY (NEEDLE) IMPLANT
NEEDLE HYPO 25X1 1.5 SAFETY (NEEDLE) ×2 IMPLANT
NS IRRIG 1000ML POUR BTL (IV SOLUTION) ×2 IMPLANT
PACK BASIC VI WITH GOWN DISP (CUSTOM PROCEDURE TRAY) ×2 IMPLANT
PENCIL BUTTON HOLSTER BLD 10FT (ELECTRODE) ×2 IMPLANT
SPONGE GAUZE 4X4 12PLY (GAUZE/BANDAGES/DRESSINGS) ×2 IMPLANT
SPONGE LAP 4X18 X RAY DECT (DISPOSABLE) ×2 IMPLANT
STRIP CLOSURE SKIN 1/2X4 (GAUZE/BANDAGES/DRESSINGS) IMPLANT
SUT MNCRL AB 4-0 PS2 18 (SUTURE) ×2 IMPLANT
SUT VIC AB 2-0 CT1 27 (SUTURE) ×1
SUT VIC AB 2-0 CT1 TAPERPNT 27 (SUTURE) ×1 IMPLANT
SUT VIC AB 3-0 54XBRD REEL (SUTURE) ×1 IMPLANT
SUT VIC AB 3-0 BRD 54 (SUTURE) ×1
SUT VIC AB 3-0 SH 27 (SUTURE) ×1
SUT VIC AB 3-0 SH 27XBRD (SUTURE) ×1 IMPLANT
SYR BULB IRRIGATION 50ML (SYRINGE) IMPLANT
SYR CONTROL 10ML LL (SYRINGE) ×2 IMPLANT
TAPE STRIPS DRAPE STRL (GAUZE/BANDAGES/DRESSINGS) ×2 IMPLANT
TOWEL OR 17X26 10 PK STRL BLUE (TOWEL DISPOSABLE) ×2 IMPLANT
YANKAUER SUCT BULB TIP 10FT TU (MISCELLANEOUS) ×2 IMPLANT

## 2012-03-12 NOTE — Transfer of Care (Signed)
Immediate Anesthesia Transfer of Care Note  Patient: Sherry Hayes  Procedure(s) Performed: Procedure(s) (LRB) with comments: EXCISION MASS (N/A) - Excision of Abdominal Wall Mass  Patient Location: PACU  Anesthesia Type:General  Level of Consciousness: sedated  Airway & Oxygen Therapy: Patient Spontanous Breathing and Patient connected to face mask oxygen  Post-op Assessment: Report given to PACU RN and Post -op Vital signs reviewed and stable  Post vital signs: Reviewed and stable  Complications: No apparent anesthesia complications

## 2012-03-12 NOTE — Op Note (Signed)
EXCISION MASS  Procedure Note  Sherry Hayes 03/12/2012   Pre-op Diagnosis: abdominal wall mass     Post-op Diagnosis: same  Procedure(s): EXCISION 5 CM LEFT LOWER QUADRANT ABDOMINAL WALL MASS  Surgeon(s): Shelly Rubenstein, MD  Anesthesia: General  Staff:  Agustin Cree, RN - Circulator Apolonio Schneiders, RN - Circulator Assistant Audrie Gallus, CST - Scrub Person  Estimated Blood Loss: Minimal               Specimens: sent to path         Indications: This is a 27 year old female with a left lower quadrant abdominal wall mass. This appears consistent with an endometrioma. Because of the increase in size and her persistent discomfort, the decision has been made to excise the mass  Procedure: The patient was brought to the operating room and identified as the correct patient. She was placed supine on the operating room table and general anesthesia was induced. Her abdomen was then prepped and draped in the usual sterile fashion. I anesthetized the skin at the lower edge of her C-section scar with Marcaine. I made an incision through the previous scar with a scalpel and the left lower quadrant. I took this down into the subcutaneous tissue with the electrocautery. The mass at its itself was approximately 5 cm in size and quite firm. I had to excise it in its entirety with the electrocautery. I had to remove part of the fascia of the abdominal wall while excising the mass. Once the mass was removed it was sent to pathology for evaluation. I irrigated the wound was sailing. I achieved hemostasis with the cautery. I anesthetized further with Marcaine. I then reapproximated the fascia with several 2-0 Vicryl sutures. I then closed the subcutaneous tissue with interrupted 3-0 Vicryl sutures and closed the skin with a running 4-0 Monocryl. Steri-Strips, gauze, and Tegaderm were then applied. The patient tolerated the procedure well. All counts were correct at the end of the procedure.  The patient was then extubated in the operating room and taken in a stable condition to the recovery room. Admire Bunnell A   Date: 03/12/2012  Time: 11:02 AM

## 2012-03-12 NOTE — Anesthesia Preprocedure Evaluation (Addendum)
Anesthesia Evaluation  Patient identified by MRN, date of birth, ID band Patient awake    Reviewed: Allergy & Precautions, H&P , NPO status , Patient's Chart, lab work & pertinent test results  Airway Mallampati: II TM Distance: >3 FB Neck ROM: full    Dental No notable dental hx. (+) Teeth Intact and Dental Advisory Given   Pulmonary neg pulmonary ROS,  breath sounds clear to auscultation  Pulmonary exam normal       Cardiovascular Exercise Tolerance: Good negative cardio ROS  Rhythm:regular Rate:Normal     Neuro/Psych negative neurological ROS  negative psych ROS   GI/Hepatic negative GI ROS, Neg liver ROS,   Endo/Other  negative endocrine ROS  Renal/GU negative Renal ROS  negative genitourinary   Musculoskeletal   Abdominal   Peds  Hematology negative hematology ROS (+) anemia ,   Anesthesia Other Findings   Reproductive/Obstetrics negative OB ROS                           Anesthesia Physical Anesthesia Plan  ASA: II  Anesthesia Plan: General   Post-op Pain Management:    Induction: Intravenous  Airway Management Planned: LMA  Additional Equipment:   Intra-op Plan:   Post-operative Plan:   Informed Consent: I have reviewed the patients History and Physical, chart, labs and discussed the procedure including the risks, benefits and alternatives for the proposed anesthesia with the patient or authorized representative who has indicated his/her understanding and acceptance.   Dental Advisory Given  Plan Discussed with: CRNA and Surgeon  Anesthesia Plan Comments:         Anesthesia Quick Evaluation

## 2012-03-12 NOTE — Anesthesia Postprocedure Evaluation (Signed)
  Anesthesia Post-op Note  Patient: Sherry Hayes  Procedure(s) Performed: Procedure(s) (LRB): EXCISION MASS (N/A)  Patient Location: PACU  Anesthesia Type: General  Level of Consciousness: awake and alert   Airway and Oxygen Therapy: Patient Spontanous Breathing  Post-op Pain: mild  Post-op Assessment: Post-op Vital signs reviewed, Patient's Cardiovascular Status Stable, Respiratory Function Stable, Patent Airway and No signs of Nausea or vomiting  Last Vitals:  Filed Vitals:   03/12/12 1100  BP: 137/76  Pulse: 95  Temp: 36.9 C  Resp: 23    Post-op Vital Signs: stable   Complications: No apparent anesthesia complications

## 2012-03-12 NOTE — Interval H&P Note (Signed)
History and Physical Interval Note: no change in H and P  03/12/2012 9:48 AM  Sherry Hayes  has presented today for surgery, with the diagnosis of abdominal wall mass  The various methods of treatment have been discussed with the patient and family. After consideration of risks, benefits and other options for treatment, the patient has consented to  Procedure(s) (LRB) with comments: EXCISION MASS (N/A) - Excision of Abdominal Wall Mass as a surgical intervention .  The patient's history has been reviewed, patient examined, no change in status, stable for surgery.  I have reviewed the patient's chart and labs.  Questions were answered to the patient's satisfaction.     Renee Erb A

## 2012-03-13 ENCOUNTER — Encounter (HOSPITAL_COMMUNITY): Payer: Self-pay | Admitting: Surgery

## 2012-03-16 ENCOUNTER — Telehealth (INDEPENDENT_AMBULATORY_CARE_PROVIDER_SITE_OTHER): Payer: Self-pay | Admitting: General Surgery

## 2012-03-16 ENCOUNTER — Encounter (INDEPENDENT_AMBULATORY_CARE_PROVIDER_SITE_OTHER): Payer: Self-pay | Admitting: General Surgery

## 2012-03-16 NOTE — Telephone Encounter (Signed)
Pt called asking for a letter excusing her form work on 1/18-1/19 due to her still not feeling well enough after surgery to go. I wrote this letter and faxed it it [phone] and [phone].

## 2012-03-31 ENCOUNTER — Encounter (INDEPENDENT_AMBULATORY_CARE_PROVIDER_SITE_OTHER): Payer: Medicaid Other | Admitting: Surgery

## 2012-04-02 ENCOUNTER — Telehealth (INDEPENDENT_AMBULATORY_CARE_PROVIDER_SITE_OTHER): Payer: Self-pay | Admitting: General Surgery

## 2012-04-02 NOTE — Telephone Encounter (Signed)
Called patient back to reschedule her apt with Dr Magnus Ivan and I moved her 04-07-12, then she state that the hydrocodone med was making her tongue itch. I talked to Dr Magnus Ivan and he stated that she needed to just take tylenol or aleve and he was not going to call or write any more pain med because she is to far out from surgery

## 2012-04-07 ENCOUNTER — Ambulatory Visit (INDEPENDENT_AMBULATORY_CARE_PROVIDER_SITE_OTHER): Payer: Medicaid Other | Admitting: Surgery

## 2012-04-07 ENCOUNTER — Encounter (INDEPENDENT_AMBULATORY_CARE_PROVIDER_SITE_OTHER): Payer: Self-pay | Admitting: Surgery

## 2012-04-07 VITALS — BP 126/72 | HR 66 | Temp 97.0°F | Resp 14 | Ht 63.0 in | Wt 146.2 lb

## 2012-04-07 DIAGNOSIS — Z09 Encounter for follow-up examination after completed treatment for conditions other than malignant neoplasm: Secondary | ICD-10-CM

## 2012-04-07 NOTE — Progress Notes (Signed)
Subjective:     Patient ID: Sherry Hayes, female   DOB: [birth year], 27 y.o.   MRN: [id number]  HPI He she is here for first postop visit status post excision of an abdominal wall mass. She is doing well has no complaints.  Review of Systems     Objective:   Physical Exam On exam, her incision is well-healed. The final pathology confirmed an endometrioma    Assessment:     Patient status post removal of left lower quadrant abdominal wall mass which was confirmed to be an endometrioma     Plan:     She may resume normal activities. I will see her back as needed

## 2012-05-18 ENCOUNTER — Emergency Department (HOSPITAL_COMMUNITY)
Admission: EM | Admit: 2012-05-18 | Discharge: 2012-05-18 | Disposition: A | Discharge status: Home / Self Care | Payer: Medicaid Other | Attending: Emergency Medicine | Admitting: Emergency Medicine

## 2012-05-18 ENCOUNTER — Emergency Department (HOSPITAL_COMMUNITY): Payer: Medicaid Other

## 2012-05-18 ENCOUNTER — Encounter (HOSPITAL_COMMUNITY): Payer: Self-pay | Admitting: *Deleted

## 2012-05-18 DIAGNOSIS — Z862 Personal history of diseases of the blood and blood-forming organs and certain disorders involving the immune mechanism: Secondary | ICD-10-CM | POA: Insufficient documentation

## 2012-05-18 DIAGNOSIS — R1110 Vomiting, unspecified: Secondary | ICD-10-CM | POA: Insufficient documentation

## 2012-05-18 DIAGNOSIS — Z8619 Personal history of other infectious and parasitic diseases: Secondary | ICD-10-CM | POA: Insufficient documentation

## 2012-05-18 DIAGNOSIS — R630 Anorexia: Secondary | ICD-10-CM | POA: Insufficient documentation

## 2012-05-18 DIAGNOSIS — F17200 Nicotine dependence, unspecified, uncomplicated: Secondary | ICD-10-CM | POA: Insufficient documentation

## 2012-05-18 DIAGNOSIS — N730 Acute parametritis and pelvic cellulitis: Secondary | ICD-10-CM

## 2012-05-18 DIAGNOSIS — Z79899 Other long term (current) drug therapy: Secondary | ICD-10-CM | POA: Insufficient documentation

## 2012-05-18 DIAGNOSIS — R350 Frequency of micturition: Secondary | ICD-10-CM | POA: Insufficient documentation

## 2012-05-18 DIAGNOSIS — N739 Female pelvic inflammatory disease, unspecified: Secondary | ICD-10-CM | POA: Insufficient documentation

## 2012-05-18 DIAGNOSIS — Z8719 Personal history of other diseases of the digestive system: Secondary | ICD-10-CM | POA: Insufficient documentation

## 2012-05-18 DIAGNOSIS — Z8679 Personal history of other diseases of the circulatory system: Secondary | ICD-10-CM | POA: Insufficient documentation

## 2012-05-18 DIAGNOSIS — Z3202 Encounter for pregnancy test, result negative: Secondary | ICD-10-CM | POA: Insufficient documentation

## 2012-05-18 LAB — URINALYSIS, ROUTINE W REFLEX MICROSCOPIC
Bilirubin Urine: NEGATIVE
Ketones, ur: 15 mg/dL — AB
Leukocytes, UA: NEGATIVE
Nitrite: NEGATIVE
Urobilinogen, UA: 1 mg/dL (ref 0.0–1.0)
pH: 7.5 (ref 5.0–8.0)

## 2012-05-18 LAB — CBC WITH DIFFERENTIAL/PLATELET
Basophils Absolute: 0 10*3/uL (ref 0.0–0.1)
Eosinophils Absolute: 0 10*3/uL (ref 0.0–0.7)
Eosinophils Relative: 0 % (ref 0–5)
HCT: 31 % — ABNORMAL LOW (ref 36.0–46.0)
Lymphocytes Relative: 5 % — ABNORMAL LOW (ref 12–46)
MCH: 27.9 pg (ref 26.0–34.0)
MCV: 82.4 fL (ref 78.0–100.0)
Monocytes Absolute: 1.2 10*3/uL — ABNORMAL HIGH (ref 0.1–1.0)
Platelets: 284 10*3/uL (ref 150–400)
RDW: 13.2 % (ref 11.5–15.5)
WBC: 19.7 10*3/uL — ABNORMAL HIGH (ref 4.0–10.5)

## 2012-05-18 LAB — COMPREHENSIVE METABOLIC PANEL
ALT: 17 U/L (ref 0–35)
AST: 20 U/L (ref 0–37)
Albumin: 4 g/dL (ref 3.5–5.2)
Alkaline Phosphatase: 50 U/L (ref 39–117)
CO2: 23 mEq/L (ref 19–32)
Chloride: 98 mEq/L (ref 96–112)
GFR calc non Af Amer: >90 mL/min (ref >90)
Potassium: 3.4 mEq/L — ABNORMAL LOW (ref 3.5–5.1)
Sodium: 134 mEq/L — ABNORMAL LOW (ref 135–145)
Total Bilirubin: 2 mg/dL — ABNORMAL HIGH (ref 0.3–1.2)

## 2012-05-18 LAB — POCT PREGNANCY, URINE: Preg Test, Ur: NEGATIVE

## 2012-05-18 MED ORDER — SODIUM CHLORIDE 0.9 % IV SOLN
1000.0000 mL | Freq: Once | INTRAVENOUS | Status: AC
  Administered 2012-05-18: 1000 mL via INTRAVENOUS

## 2012-05-18 MED ORDER — HYDROCODONE-ACETAMINOPHEN 5-325 MG PO TABS: 1.0000 | ORAL_TABLET | Freq: Four times a day (QID) | ORAL | Status: DC | PRN

## 2012-05-18 MED ORDER — SODIUM CHLORIDE 0.9 % IV SOLN
1000.0000 mL | INTRAVENOUS | Status: DC
  Administered 2012-05-18: 1000 mL via INTRAVENOUS

## 2012-05-18 MED ORDER — ACETAMINOPHEN 325 MG PO TABS
650.0000 mg | ORAL_TABLET | Freq: Once | ORAL | Status: AC
  Administered 2012-05-18: 650 mg via ORAL
  Filled 2012-05-18: qty 2

## 2012-05-18 MED ORDER — PROMETHAZINE HCL 25 MG PO TABS: 25.0000 mg | ORAL_TABLET | Freq: Four times a day (QID) | ORAL | Status: DC | PRN

## 2012-05-18 MED ORDER — IOHEXOL 300 MG/ML  SOLN
50.0000 mL | Freq: Once | INTRAMUSCULAR | Status: AC | PRN
  Administered 2012-05-18: 50 mL via ORAL

## 2012-05-18 MED ORDER — DEXTROSE 5 % IV SOLN
1.0000 g | INTRAVENOUS | Status: DC
  Administered 2012-05-18: 1 g via INTRAVENOUS
  Filled 2012-05-18: qty 10

## 2012-05-18 MED ORDER — DOXYCYCLINE HYCLATE 100 MG PO TABS: 100.0000 mg | ORAL_TABLET | Freq: Two times a day (BID) | ORAL | Status: DC

## 2012-05-18 MED ORDER — ONDANSETRON HCL 4 MG/2ML IJ SOLN
4.0000 mg | Freq: Once | INTRAMUSCULAR | Status: AC
  Administered 2012-05-18: 4 mg via INTRAVENOUS
  Filled 2012-05-18: qty 2

## 2012-05-18 MED ORDER — DOXYCYCLINE HYCLATE 100 MG PO TABS
100.0000 mg | ORAL_TABLET | Freq: Two times a day (BID) | ORAL | Status: DC
  Administered 2012-05-18: 100 mg via ORAL
  Filled 2012-05-18: qty 1

## 2012-05-18 MED ORDER — IOHEXOL 300 MG/ML  SOLN
100.0000 mL | Freq: Once | INTRAMUSCULAR | Status: AC | PRN
  Administered 2012-05-18: 80 mL via INTRAVENOUS

## 2012-05-18 MED ORDER — AZITHROMYCIN 250 MG PO TABS
1000.0000 mg | ORAL_TABLET | Freq: Once | ORAL | Status: AC
  Administered 2012-05-18: 1000 mg via ORAL
  Filled 2012-05-18: qty 4

## 2012-05-18 NOTE — ED Notes (Signed)
Pt reports L pelvic and suprapubic pain which started yesterday.  Pt reports vomiting x 1 yesterday, none today.  Pt reports urinary frequency, but denies any dysuria or any vaginal d/c at this time.

## 2012-05-18 NOTE — ED Notes (Signed)
Per pt: Pt began having abdominal pain that radiates from mid-low abdomin to right lower abdomen; the pain is tender and is constant.  Pt vomited x 1 yesterday; some nausea at times.  Denies diarrhea.  LMP 05/12/11.

## 2012-05-18 NOTE — ED Notes (Signed)
Patient transported to X-ray 

## 2012-05-18 NOTE — ED Provider Notes (Signed)
History    CSN: [id number] Arrival date & time 05/18/12  1543 First MD Initiated Contact with Patient 05/18/12 1638      Chief Complaint  Patient presents with  . Abdominal Pain     Patient is a 27 y.o. female presenting with abdominal pain. The history is provided by the patient.  Abdominal Pain Pain location:  Suprapubic, periumbilical and LLQ Pain quality: dull   Pain severity:  Moderate Onset quality:  Sudden Duration:  1 day Progression:  Improving Chronicity:  New Relieved by:  Nothing Worsened by:  Palpation and movement (She feels like she will vomit whe she eats.) Ineffective treatments:  OTC medications Associated symptoms: anorexia and vomiting   Associated symptoms: no belching, no cough, no dysuria, no vaginal bleeding and no vaginal discharge     Past Medical History  Diagnosis Date  . Gonorrhea   . Chlamydia   . Anemia   . Pelvic pain   . Hydrosalpinx   . Abnormal Pap smear     ASCUS +HPV  . 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 Dysrhythmia     stress induced- none x 1 year/ "skipping"  . GERD (gastroesophageal reflux disease)     diet related    Past Surgical History  Procedure Laterality Date  . Cesarean section    . Mass excision  03/12/2012    Procedure: EXCISION MASS;  Surgeon: Shelly Rubenstein, MD;  Location: [facility];  Service: General;  Laterality: N/A;  Excision of Abdominal Wall Mass    History reviewed. No pertinent family history.  History  Substance Use Topics  . Smoking status: Current Every Day Smoker -- 0.25 packs/day    Types: Cigarettes  . Smokeless tobacco: Never Used  . Alcohol Use: No    OB History   Grav Para Term Preterm Abortions TAB SAB Ect Mult Living   2 1 0 1 1  1   1       Review of Systems  Respiratory: Negative for cough.   Gastrointestinal: Positive for vomiting, abdominal pain and anorexia.  Genitourinary: Positive for frequency. Negative for dysuria, vaginal bleeding and  vaginal discharge.  All other systems reviewed and are negative.    Allergies  Review of patient's allergies indicates no known allergies.  Home Medications   Current Outpatient Rx  Name  Route  Sig  Dispense  Refill  . Aspirin-Acetaminophen-Caffeine (GOODY HEADACHE PO)   Oral   Take 1 Package by mouth as needed. Headache         . doxycycline (VIBRA-TABS) 100 MG tablet   Oral   Take 1 tablet (100 mg total) by mouth 2 (two) times daily.   28 tablet   0   . HYDROcodone-acetaminophen (NORCO) 5-325 MG per tablet   Oral   Take 1-2 tablets by mouth every 6 (six) hours as needed for pain.   30 tablet   0   . promethazine (PHENERGAN) 25 MG tablet   Oral   Take 1 tablet (25 mg total) by mouth every 6 (six) hours as needed for nausea.   30 tablet   0     BP 115/58  Pulse 112  Temp(Src) 100.1 F (37.8 C) (Oral)  Resp 18  SpO2 97%  LMP 05/11/2012  Physical Exam  Nursing note and vitals reviewed. Constitutional: She appears well-developed and well-nourished. No distress.  HENT:  Head: Normocephalic and atraumatic.  Right Ear: External ear normal.  Left Ear:  External ear normal.  Eyes: Conjunctivae are normal. Right eye exhibits no discharge. Left eye exhibits no discharge. No scleral icterus.  Neck: Neck supple. No tracheal deviation present.  Cardiovascular: Normal rate, regular rhythm and intact distal pulses.   Pulmonary/Chest: Effort normal and breath sounds normal. No stridor. No respiratory distress. She has no wheezes. She has no rales.  Abdominal: Soft. Bowel sounds are normal. She exhibits no distension. There is tenderness in the suprapubic area and left lower quadrant. There is no rebound and no guarding.  Musculoskeletal: She exhibits no edema and no tenderness.  Neurological: She is alert. She has normal strength. No sensory deficit. Cranial nerve deficit:  no gross defecits noted. She exhibits normal muscle tone. She displays no seizure activity.  Coordination normal.  Skin: Skin is warm and dry. No rash noted.  Psychiatric: She has a normal mood and affect.    ED Course  Procedures (including critical care time) Medications  0.9 %  sodium chloride infusion (0 mLs Intravenous Stopped 05/18/12 1933)    Followed by  0.9 %  sodium chloride infusion (0 mLs Intravenous Stopped 05/18/12 2147)  cefTRIAXone (ROCEPHIN) 1 g in dextrose 5 % 50 mL IVPB (0 g Intravenous Stopped 05/18/12 2102)  doxycycline (VIBRA-TABS) tablet 100 mg (not administered)  ondansetron (ZOFRAN) injection 4 mg (4 mg Intravenous Given 05/18/12 1806)  acetaminophen (TYLENOL) tablet 650 mg (650 mg Oral Given 05/18/12 1806)  azithromycin (ZITHROMAX) tablet 1,000 mg (1,000 mg Oral Given 05/18/12 2000)  iohexol (OMNIPAQUE) 300 MG/ML solution 50 mL (50 mLs Oral Contrast Given 05/18/12 2039)  iohexol (OMNIPAQUE) 300 MG/ML solution 100 mL (80 mLs Intravenous Contrast Given 05/18/12 2039)    Labs Reviewed  URINALYSIS, ROUTINE W REFLEX MICROSCOPIC - Abnormal; Notable for the following:    Ketones, ur 15 (*)    All other components within normal limits  COMPREHENSIVE METABOLIC PANEL - Abnormal; Notable for the following:    Sodium 134 (*)    Potassium 3.4 (*)    Total Bilirubin 2.0 (*)    All other components within normal limits  CBC WITH DIFFERENTIAL - Abnormal; Notable for the following:    WBC 19.7 (*)    RBC 3.76 (*)    Hemoglobin 10.5 (*)    HCT 31.0 (*)    Neutrophils Relative 89 (*)    Neutro Abs 17.4 (*)    Lymphocytes Relative 5 (*)    Monocytes Absolute 1.2 (*)    All other components within normal limits  URINE CULTURE  GC/CHLAMYDIA PROBE AMP  LIPASE, BLOOD  RPR  POCT PREGNANCY, URINE   Ct Abdomen Pelvis W Contrast  05/18/2012  *RADIOLOGY REPORT*  Clinical Data: Abdominal pain.  CT ABDOMEN AND PELVIS WITH CONTRAST  Technique:  Multidetector CT imaging of the abdomen and pelvis was performed following the standard protocol during bolus administration of  intravenous contrast.  Contrast: 50mL OMNIPAQUE IOHEXOL 300 MG/ML  SOLN, 80mL OMNIPAQUE IOHEXOL 300 MG/ML  SOLN  Comparison: Radiographs dated 05/18/2012  Findings: The patient has a dilated fallopian tube on the left with intense enhancement of the mucosa consistent with pelvic inflammatory disease.  There is a 5.2 cm low density lesion in the right adnexa which could be a hydrosalpinx or an ovarian cyst. There is only a tiny amount of free fluid in the pelvic cul-de-sac. Prominent fluid in the endometrial cavity.  The patient has slight periportal edema which is nonspecific.  Does the patient have a history of hepatitis?  Liver parenchyma is  otherwise normal.  Biliary tree is normal.  Spleen, pancreas, adrenal glands, and kidneys are normal.  The bowel is normal including the terminal ileum and appendix.  No osseous abnormality.  IMPRESSION:  1. Left hydrosalpinx with marked enhancement of the mucosa is consistent with pelvic inflammatory disease. 2.  5.2 cm cystic area in the right adnexa which could be hydrosalpinx or ovarian cyst. 3.  Slight periportal edema, nonspecific.  The possibility of hepatitis should be considered.   Original Report Authenticated By: Francene Boyers, M.D.    Dg Abd Acute W/chest  05/18/2012  *RADIOLOGY REPORT*  Clinical Data: Abdominal pain with recent nausea and vomiting  ACUTE ABDOMEN SERIES (ABDOMEN 2 VIEW & CHEST 1 VIEW)  Comparison: None.  Findings: Normal heart, mediastinal, and hilar contours.  Pulmonary vascularity is normal.  The trachea is midline.  The bowel gas pattern is nonobstructive. Negative for free intraperitoneal air. Moderate amount of stool in the colon.  No urinary tract stones are seen.  No abdominal mass effect.  No acute osseous abnormality.  IMPRESSION:  1.  Nonobstructive bowel gas pattern. 2.  No acute cardiopulmonary disease.   Original Report Authenticated By: Britta Mccreedy, M.D.      1. PID (acute pelvic inflammatory disease)       MDM  Pt  symptoms, physical exam and CT scan suggest PID.  Pt is tolerating PO.  Discussed findings with Dr Penne Lash.  Will give does of IV abs and dc home on doxycyline.  Pt will follow up in the GYN clinic.   Precautions discussed to return to [facility] if she worsens or is unable to keep down her medications.        Celene Kras, MD 05/18/12 2152

## 2012-05-19 LAB — GC/CHLAMYDIA PROBE AMP: CT Probe RNA: NEGATIVE

## 2012-05-19 LAB — URINE CULTURE

## 2012-05-21 ENCOUNTER — Encounter: Payer: Medicaid Other | Admitting: Medical

## 2012-08-13 ENCOUNTER — Ambulatory Visit: Payer: Medicaid Other | Admitting: Family Medicine

## 2013-07-14 ENCOUNTER — Encounter (HOSPITAL_COMMUNITY): Payer: Self-pay | Admitting: Emergency Medicine

## 2013-07-14 ENCOUNTER — Emergency Department (HOSPITAL_COMMUNITY)
Admission: EM | Admit: 2013-07-14 | Discharge: 2013-07-14 | Disposition: A | Discharge status: Home / Self Care | Payer: Medicaid Other | Attending: Emergency Medicine | Admitting: Emergency Medicine

## 2013-07-14 DIAGNOSIS — Z8719 Personal history of other diseases of the digestive system: Secondary | ICD-10-CM | POA: Insufficient documentation

## 2013-07-14 DIAGNOSIS — Z8679 Personal history of other diseases of the circulatory system: Secondary | ICD-10-CM | POA: Insufficient documentation

## 2013-07-14 DIAGNOSIS — Z862 Personal history of diseases of the blood and blood-forming organs and certain disorders involving the immune mechanism: Secondary | ICD-10-CM | POA: Insufficient documentation

## 2013-07-14 DIAGNOSIS — Z8619 Personal history of other infectious and parasitic diseases: Secondary | ICD-10-CM | POA: Insufficient documentation

## 2013-07-14 DIAGNOSIS — Z79899 Other long term (current) drug therapy: Secondary | ICD-10-CM | POA: Insufficient documentation

## 2013-07-14 DIAGNOSIS — Z8742 Personal history of other diseases of the female genital tract: Secondary | ICD-10-CM | POA: Insufficient documentation

## 2013-07-14 DIAGNOSIS — H109 Unspecified conjunctivitis: Secondary | ICD-10-CM | POA: Insufficient documentation

## 2013-07-14 DIAGNOSIS — F17200 Nicotine dependence, unspecified, uncomplicated: Secondary | ICD-10-CM | POA: Insufficient documentation

## 2013-07-14 MED ORDER — FLUORESCEIN SODIUM 1 MG OP STRP
1.0000 | ORAL_STRIP | Freq: Once | OPHTHALMIC | Status: AC
  Administered 2013-07-14: 1 via OPHTHALMIC
  Filled 2013-07-14: qty 1

## 2013-07-14 MED ORDER — TETRACAINE HCL 0.5 % OP SOLN
2.0000 [drp] | Freq: Once | OPHTHALMIC | Status: AC
  Administered 2013-07-14: 2 [drp] via OPHTHALMIC
  Filled 2013-07-14: qty 2

## 2013-07-14 MED ORDER — POLYMYXIN B-TRIMETHOPRIM 10000-0.1 UNIT/ML-% OP SOLN
1.0000 [drp] | OPHTHALMIC | Status: DC
  Administered 2013-07-14: 1 [drp] via OPHTHALMIC
  Filled 2013-07-14 (×2): qty 10

## 2013-07-14 NOTE — ED Provider Notes (Signed)
  Medical screening examination/treatment/procedure(s) were performed by non-physician practitioner and as supervising physician I was immediately available for consultation/collaboration.   EKG Interpretation None         Gerhard Munchobert Cheo Selvey, MD 07/14/13 [phone]

## 2013-07-14 NOTE — Discharge Instructions (Signed)
Apply 1 drop of eyedrop to right eye every 4 hrs for the next 5 days as treatment for conjunctivitis.  Follow instruction below.   Conjunctivitis Conjunctivitis is commonly called "pink eye." Conjunctivitis can be caused by bacterial or viral infection, allergies, or injuries. There is usually redness of the lining of the eye, itching, discomfort, and sometimes discharge. There may be deposits of matter along the eyelids. A viral infection usually causes a watery discharge, while a bacterial infection causes a yellowish, thick discharge. Pink eye is very contagious and spreads by direct contact. You may be given antibiotic eyedrops as part of your treatment. Before using your eye medicine, remove all drainage from the eye by washing gently with warm water and cotton balls. Continue to use the medication until you have awakened 2 mornings in a row without discharge from the eye. Do not rub your eye. This increases the irritation and helps spread infection. Use separate towels from other household members. Wash your hands with soap and water before and after touching your eyes. Use cold compresses to reduce pain and sunglasses to relieve irritation from light. Do not wear contact lenses or wear eye makeup until the infection is gone. SEEK MEDICAL CARE IF:   Your symptoms are not better after 3 days of treatment.  You have increased pain or trouble seeing.  The outer eyelids become very red or swollen. Document Released: 03/21/2004 Document Revised: 05/06/2011 Document Reviewed: 02/11/2005 [facility] Patient Information 2014 [place], [place].

## 2013-07-14 NOTE — ED Provider Notes (Signed)
CSN: [id number]     Arrival date & time 07/14/13  1030 History   First MD Initiated Contact with Patient 07/14/13 1053    This chart was scribed for Fayrene HelperBowie Leevon Upperman PA-C, a non-physician practitioner working with Gerhard Munchobert Lockwood, MD by Lewanda RifeAlexandra Hurtado, ED Scribe. This patient was seen in room TR04C/TR04C and the patient's care was started at 11:25 AM      Chief Complaint  Patient presents with  . Eye Drainage     (Consider location/radiation/quality/duration/timing/severity/associated sxs/prior Treatment) The history is provided by the patient. No language interpreter was used.   HPI Comments: Sherry Hayes is a 28 y.o. female who presents to the Emergency Department complaining of mild right eye drainage onset 2 days. Describes symptoms as gradually worsening in severity. Reports associated right eye redness, Denies any aggravating or alleviated factors. Denies associated vision change, pruritic sensation, recent trauma, reports possible exposure to pink eye.   Past Medical History  Diagnosis Date  . Gonorrhea   . Chlamydia   . Anemia   . Pelvic pain   . Hydrosalpinx   . Abnormal Pap smear     ASCUS +HPV  . 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 Dysrhythmia     stress induced- none x 1 year/ "skipping"  . GERD (gastroesophageal reflux disease)     diet related   Past Surgical History  Procedure Laterality Date  . Cesarean section    . Mass excision  03/12/2012    Procedure: EXCISION MASS;  Surgeon: Shelly Rubensteinouglas A Blackman, MD;  Location: [facility];  Service: General;  Laterality: N/A;  Excision of Abdominal Wall Mass   No family history on file. History  Substance Use Topics  . Smoking status: Current Every Day Smoker -- 0.25 packs/day    Types: Cigarettes  . Smokeless tobacco: Never Used  . Alcohol Use: No   OB History   Grav Para Term Preterm Abortions TAB SAB Ect Mult Living   2 1 0 1 1  1   1      Review of Systems  Constitutional: Negative for  fever.  Eyes: Positive for discharge and redness. Negative for itching and visual disturbance.      Allergies  Review of patient's allergies indicates no known allergies.  Home Medications   Prior to Admission medications   Medication Sig Start Date End Date Taking? Authorizing Provider  Thiamine HCl (VITAMIN B-1 PO) Take 1 tablet by mouth daily.   Yes Historical Provider, MD   BP 133/70  Pulse 81  Temp(Src) 98.2 F (36.8 C) (Oral)  Resp 20  SpO2 100%  LMP 06/15/2013 Physical Exam  Nursing note and vitals reviewed. Constitutional: She is oriented to person, place, and time. She appears well-developed and well-nourished. No distress.  HENT:  Head: Normocephalic and atraumatic.  Eyes: EOM and lids are normal. Pupils are equal, round, and reactive to light. Lids are everted and swept, no foreign bodies found. Right eye exhibits no chemosis, no discharge, no exudate and no hordeolum. No foreign body present in the right eye. Right conjunctiva is injected. Left conjunctiva is injected. No scleral icterus.  Slit lamp exam:      The right eye shows no corneal abrasion, no corneal flare, no corneal ulcer, no foreign body, no hyphema, no hypopyon and no fluorescein uptake.  Right lower eyelid is mildly edematous. No foreign bodies noted.     Visual Acuity  Right Eye Distance: 20/20 Left Eye Distance: 20/20  Bilateral Distance: 20/15  Right Eye Near:   Left Eye Near:    Bilateral Near:      Neck: Neck supple. No tracheal deviation present.  Cardiovascular: Normal rate.   Pulmonary/Chest: Effort normal. No respiratory distress.  Musculoskeletal: Normal range of motion.  Neurological: She is alert and oriented to person, place, and time.  Skin: Skin is warm and dry.  Psychiatric: She has a normal mood and affect. Her behavior is normal.    ED Course  Procedures (including critical care time) COORDINATION OF CARE:  Nursing notes reviewed. Vital signs reviewed. Initial pt  interview and examination performed.   Filed Vitals:   07/14/13 1037  BP: 133/70  Pulse: 81  Temp: 98.2 F (36.8 C)  TempSrc: Oral  Resp: 20  SpO2: 100%    10:58 AM-Discussed work up plan with pt at bedside, which includes  Orders Placed This Encounter  Procedures  . Visual acuity screening    Standing Status: Standing     Number of Occurrences: 1     Standing Expiration Date:   . Pt agrees with plan.   Treatment plan initiated: Medications  fluorescein ophthalmic strip 1 strip (not administered)  tetracaine (PONTOCAINE) 0.5 % ophthalmic solution 2 drop (not administered)   11:29 AM pt informed of upcoming visual acuity exam and fluorescein stain. Pt agrees with plan.    Initial diagnostic testing ordered.       Labs Review Labs Reviewed - No data to display  Imaging Review No results found.   EKG Interpretation None      MDM   Final diagnoses:  Conjunctivitis of right eye    BP 133/70  Pulse 81  Temp(Src) 98.2 F (36.8 C) (Oral)  Resp 20  SpO2 100%  LMP 06/15/2013   I personally performed the services described in this documentation, which was scribed in my presence. The recorded information has been reviewed and is accurate.     Fayrene HelperBowie Kristofor Michalowski, PA-C 07/14/13 1159

## 2013-07-14 NOTE — ED Notes (Signed)
Pt is here with right eye redness and draining since waking up this am.  No vision change

## 2013-07-14 NOTE — ED Notes (Signed)
Have notified pharmacy x 1 in EPIC.  Have called pharmacy twice for medication.  Still haven't received.

## 2013-07-26 ENCOUNTER — Emergency Department (HOSPITAL_COMMUNITY)
Admission: EM | Admit: 2013-07-26 | Discharge: 2013-07-26 | Disposition: A | Discharge status: Home / Self Care | Payer: Medicaid Other | Attending: Emergency Medicine | Admitting: Emergency Medicine

## 2013-07-26 ENCOUNTER — Encounter (HOSPITAL_COMMUNITY): Payer: Self-pay | Admitting: Emergency Medicine

## 2013-07-26 DIAGNOSIS — Z8679 Personal history of other diseases of the circulatory system: Secondary | ICD-10-CM | POA: Insufficient documentation

## 2013-07-26 DIAGNOSIS — H53149 Visual discomfort, unspecified: Secondary | ICD-10-CM | POA: Insufficient documentation

## 2013-07-26 DIAGNOSIS — Z79899 Other long term (current) drug therapy: Secondary | ICD-10-CM | POA: Insufficient documentation

## 2013-07-26 DIAGNOSIS — Z8742 Personal history of other diseases of the female genital tract: Secondary | ICD-10-CM | POA: Insufficient documentation

## 2013-07-26 DIAGNOSIS — F17200 Nicotine dependence, unspecified, uncomplicated: Secondary | ICD-10-CM | POA: Insufficient documentation

## 2013-07-26 DIAGNOSIS — Z862 Personal history of diseases of the blood and blood-forming organs and certain disorders involving the immune mechanism: Secondary | ICD-10-CM | POA: Insufficient documentation

## 2013-07-26 DIAGNOSIS — Z8619 Personal history of other infectious and parasitic diseases: Secondary | ICD-10-CM | POA: Insufficient documentation

## 2013-07-26 DIAGNOSIS — Z8719 Personal history of other diseases of the digestive system: Secondary | ICD-10-CM | POA: Insufficient documentation

## 2013-07-26 DIAGNOSIS — H109 Unspecified conjunctivitis: Secondary | ICD-10-CM

## 2013-07-26 MED ORDER — FLUORESCEIN SODIUM 1 MG OP STRP
1.0000 | ORAL_STRIP | Freq: Once | OPHTHALMIC | Status: AC
  Administered 2013-07-26: 1 via OPHTHALMIC
  Filled 2013-07-26: qty 1

## 2013-07-26 MED ORDER — OLOPATADINE HCL 0.1 % OP SOLN
1.0000 [drp] | Freq: Two times a day (BID) | OPHTHALMIC | Status: DC
  Administered 2013-07-26: 1 [drp] via OPHTHALMIC
  Filled 2013-07-26: qty 5

## 2013-07-26 MED ORDER — TETRACAINE HCL 0.5 % OP SOLN
2.0000 [drp] | Freq: Once | OPHTHALMIC | Status: AC
  Administered 2013-07-26: 2 [drp] via OPHTHALMIC
  Filled 2013-07-26: qty 2

## 2013-07-26 NOTE — ED Notes (Addendum)
Pt presents with complaint of bilateral eye irritation. Pt reports that she was treated on 07/14/13 for pink eye at [facility]. Pt reports that the redness has improved, however she continues to have eye irritation and is sensitive to light. Pt is A/O x4, in NAD, and vitals are WDL.

## 2013-07-26 NOTE — ED Provider Notes (Signed)
CSN: [id number]     Arrival date & time 07/26/13  1152 History   First MD Initiated Contact with Patient 07/26/13 1200     Chief Complaint  Patient presents with  . Eye Problem   The history is provided by the patient. No language interpreter was used.   This chart was scribed for non-physician practitioner Jaynie Crumble, PA-C working with Ethelda Chick, MD, by Andrew Au, ED Scribe. This patient was seen in room WTR7/WTR7 and the patient's care was started at 12:01 PM.  Sherry Hayes is a 28 y.o. female who presents to the Emergency Department complaining of right eye pain onset 1 week. Pt report it feels like there is something in her eye causing irritation. Pt was seen at 10 days ago at [facility] and was prescribed eye drops without relief to irritation. She reports redness moved to left eye but cleared up within 3-4 days.  She reports redness in the right eye has worsened. She reports she is almost out of the eye drops. Pt denies using make up to right eye. Pt denies using new products. Pt has mild visual disturbances. Pt denies wearing contacts.   Past Medical History  Diagnosis Date  . Gonorrhea   . Chlamydia   . Anemia   . Pelvic pain   . Hydrosalpinx   . Abnormal Pap smear     ASCUS +HPV  . 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 Dysrhythmia     stress induced- none x 1 year/ "skipping"  . GERD (gastroesophageal reflux disease)     diet related   Past Surgical History  Procedure Laterality Date  . Cesarean section    . Mass excision  03/12/2012    Procedure: EXCISION MASS;  Surgeon: Shelly Rubenstein, MD;  Location: [facility];  Service: General;  Laterality: N/A;  Excision of Abdominal Wall Mass   No family history on file. History  Substance Use Topics  . Smoking status: Current Every Day Smoker -- 0.25 packs/day    Types: Cigarettes  . Smokeless tobacco: Never Used  . Alcohol Use: No   OB History   Grav Para Term Preterm Abortions TAB  SAB Ect Mult Living   2 1 0 1 1  1   1      Review of Systems  Eyes: Positive for photophobia, pain, redness and visual disturbance. Negative for discharge and itching.   Allergies  Review of patient's allergies indicates no known allergies.  Home Medications   Prior to Admission medications   Medication Sig Start Date End Date Taking? Authorizing Provider  Thiamine HCl (VITAMIN B-1 PO) Take 1 tablet by mouth daily.    Historical Provider, MD   BP 126/76  Pulse 81  Temp(Src) 99.1 F (37.3 C) (Oral)  Resp 18  SpO2 100%  LMP 06/15/2013 Physical Exam  Nursing note and vitals reviewed. Constitutional: She is oriented to person, place, and time. She appears well-developed and well-nourished. No distress.  HENT:  Head: Normocephalic and atraumatic.  Eyes: EOM are normal. Pupils are equal, round, and reactive to light. Lids are everted and swept, no foreign bodies found. Right conjunctiva is injected.  Slit lamp exam:      The right eye shows no hyphema, no hypopyon and no fluorescein uptake.       The left eye shows no hyphema and no hypopyon.  Neck: Normal range of motion. Neck supple.  Cardiovascular: Normal rate.   Pulmonary/Chest:  Effort normal.  Musculoskeletal: Normal range of motion.  Neurological: She is alert and oriented to person, place, and time.  Skin: Skin is warm and dry.  Psychiatric: She has a normal mood and affect. Her behavior is normal.    ED Course  Procedures   12:02 PM-Discussed treatment plan which includes eye drops Labs Review Labs Reviewed - No data to display  Imaging Review No results found.   EKG Interpretation None      MDM   Final diagnoses:  Conjunctivitis, right eye   Visual acuity L20/30, R20/50, bilateral 20/20  Fluorescein stain negative. Will try patanol drops. Discussed with Dr. Allena KatzPatel, ophthalmoloty, who will see pt tomorrow at 10:45.  Pt agrees with plan.  Filed Vitals:   07/26/13 1200  BP: 126/76  Pulse: 81   Temp: 99.1 F (37.3 C)  TempSrc: Oral  Resp: 18  SpO2: 100%     I personally performed the services described in this documentation, which was scribed in my presence. The recorded information has been reviewed and is accurate.    Lottie Musselatyana A Shelli Portilla, PA-C 07/26/13 1633

## 2013-07-26 NOTE — Discharge Instructions (Signed)
Use patanol drops as prescribed. Try cool compresses. Follow up with Dr. Allena Katz tomorrow at 10:45 am for further evaluation.     Conjunctivitis Conjunctivitis is commonly called "pink eye." Conjunctivitis can be caused by bacterial or viral infection, allergies, or injuries. There is usually redness of the lining of the eye, itching, discomfort, and sometimes discharge. There may be deposits of matter along the eyelids. A viral infection usually causes a watery discharge, while a bacterial infection causes a yellowish, thick discharge. Pink eye is very contagious and spreads by direct contact. You may be given antibiotic eyedrops as part of your treatment. Before using your eye medicine, remove all drainage from the eye by washing gently with warm water and cotton balls. Continue to use the medication until you have awakened 2 mornings in a row without discharge from the eye. Do not rub your eye. This increases the irritation and helps spread infection. Use separate towels from other household members. Wash your hands with soap and water before and after touching your eyes. Use cold compresses to reduce pain and sunglasses to relieve irritation from light. Do not wear contact lenses or wear eye makeup until the infection is gone. SEEK MEDICAL CARE IF:   Your symptoms are not better after 3 days of treatment.  You have increased pain or trouble seeing.  The outer eyelids become very red or swollen. Document Released: 03/21/2004 Document Revised: 05/06/2011 Document Reviewed: 02/11/2005 [facility] Patient Information 2014 [place], [place].

## 2013-07-30 NOTE — ED Provider Notes (Signed)
Medical screening examination/treatment/procedure(s) were performed by non-physician practitioner and as supervising physician I was immediately available for consultation/collaboration.   EKG Interpretation None       Martha K Linker, MD 07/30/13 1112 

## 2013-10-13 ENCOUNTER — Encounter (HOSPITAL_COMMUNITY): Payer: Self-pay | Admitting: Emergency Medicine

## 2013-10-13 ENCOUNTER — Emergency Department (HOSPITAL_COMMUNITY)
Admission: EM | Admit: 2013-10-13 | Discharge: 2013-10-13 | Disposition: A | Discharge status: Home / Self Care | Payer: Medicaid Other | Attending: Emergency Medicine | Admitting: Emergency Medicine

## 2013-10-13 DIAGNOSIS — R109 Unspecified abdominal pain: Secondary | ICD-10-CM | POA: Diagnosis present

## 2013-10-13 DIAGNOSIS — Z79899 Other long term (current) drug therapy: Secondary | ICD-10-CM | POA: Diagnosis not present

## 2013-10-13 DIAGNOSIS — Z862 Personal history of diseases of the blood and blood-forming organs and certain disorders involving the immune mechanism: Secondary | ICD-10-CM | POA: Insufficient documentation

## 2013-10-13 DIAGNOSIS — Z8619 Personal history of other infectious and parasitic diseases: Secondary | ICD-10-CM | POA: Insufficient documentation

## 2013-10-13 DIAGNOSIS — F17200 Nicotine dependence, unspecified, uncomplicated: Secondary | ICD-10-CM | POA: Diagnosis not present

## 2013-10-13 DIAGNOSIS — R1010 Upper abdominal pain, unspecified: Secondary | ICD-10-CM

## 2013-10-13 DIAGNOSIS — Z3202 Encounter for pregnancy test, result negative: Secondary | ICD-10-CM | POA: Diagnosis not present

## 2013-10-13 DIAGNOSIS — Z8719 Personal history of other diseases of the digestive system: Secondary | ICD-10-CM | POA: Insufficient documentation

## 2013-10-13 DIAGNOSIS — Z8679 Personal history of other diseases of the circulatory system: Secondary | ICD-10-CM | POA: Insufficient documentation

## 2013-10-13 DIAGNOSIS — Z8742 Personal history of other diseases of the female genital tract: Secondary | ICD-10-CM | POA: Insufficient documentation

## 2013-10-13 DIAGNOSIS — Z9889 Other specified postprocedural states: Secondary | ICD-10-CM | POA: Insufficient documentation

## 2013-10-13 LAB — COMPREHENSIVE METABOLIC PANEL
ALK PHOS: 50 U/L (ref 39–117)
ALT: 19 U/L (ref 0–35)
AST: 25 U/L (ref 0–37)
Albumin: 4.1 g/dL (ref 3.5–5.2)
Anion gap: 13 (ref 5–15)
BILIRUBIN TOTAL: 0.5 mg/dL (ref 0.3–1.2)
BUN: 9 mg/dL (ref 6–23)
CALCIUM: 9.6 mg/dL (ref 8.4–10.5)
CHLORIDE: 104 meq/L (ref 96–112)
CO2: 26 meq/L (ref 19–32)
Creatinine, Ser: 0.8 mg/dL (ref 0.50–1.10)
GLUCOSE: 94 mg/dL (ref 70–99)
POTASSIUM: 3.8 meq/L (ref 3.7–5.3)
SODIUM: 143 meq/L (ref 137–147)
Total Protein: 7.6 g/dL (ref 6.0–8.3)

## 2013-10-13 LAB — URINALYSIS, ROUTINE W REFLEX MICROSCOPIC
BILIRUBIN URINE: NEGATIVE
GLUCOSE, UA: NEGATIVE mg/dL
HGB URINE DIPSTICK: NEGATIVE
KETONES UR: NEGATIVE mg/dL
Leukocytes, UA: NEGATIVE
Nitrite: NEGATIVE
PH: 7 (ref 5.0–8.0)
PROTEIN: NEGATIVE mg/dL
Specific Gravity, Urine: 1.024 (ref 1.005–1.030)
Urobilinogen, UA: 1 mg/dL (ref 0.0–1.0)

## 2013-10-13 LAB — POC URINE PREG, ED: Preg Test, Ur: NEGATIVE

## 2013-10-13 LAB — CBC WITH DIFFERENTIAL/PLATELET
Basophils Absolute: 0 10*3/uL (ref 0.0–0.1)
Basophils Relative: 0 % (ref 0–1)
EOS PCT: 1 % (ref 0–5)
Eosinophils Absolute: 0.1 10*3/uL (ref 0.0–0.7)
HCT: 32.5 % — ABNORMAL LOW (ref 36.0–46.0)
Hemoglobin: 10.8 g/dL — ABNORMAL LOW (ref 12.0–15.0)
LYMPHS ABS: 2 10*3/uL (ref 0.7–4.0)
LYMPHS PCT: 38 % (ref 12–46)
MCH: 27.4 pg (ref 26.0–34.0)
MCHC: 33.2 g/dL (ref 30.0–36.0)
MCV: 82.5 fL (ref 78.0–100.0)
Monocytes Absolute: 0.4 10*3/uL (ref 0.1–1.0)
Monocytes Relative: 7 % (ref 3–12)
NEUTROS ABS: 2.7 10*3/uL (ref 1.7–7.7)
Neutrophils Relative %: 54 % (ref 43–77)
PLATELETS: 285 10*3/uL (ref 150–400)
RBC: 3.94 MIL/uL (ref 3.87–5.11)
RDW: 14.1 % (ref 11.5–15.5)
WBC: 5.2 10*3/uL (ref 4.0–10.5)

## 2013-10-13 LAB — LIPASE, BLOOD: Lipase: 30 U/L (ref 11–59)

## 2013-10-13 NOTE — ED Notes (Signed)
Pt c/o upper abd pain worse with palpation x 1 week; pt sts "feels like a knot is there"

## 2013-10-13 NOTE — Discharge Instructions (Signed)
Please read and follow all provided instructions.  Your diagnoses today include:  1. Pain of upper abdomen     Tests performed today include:  Blood counts and electrolytes  Blood tests to check liver and kidney function  Blood tests to check pancreas function  Urine test to look for infection and pregnancy (in women)  Vital signs. See below for your results today.   Medications prescribed:   None  Take any prescribed medications only as directed.  Home care instructions:   Follow any educational materials contained in this packet.  Follow-up instructions: Please follow-up with your primary care provider in the next 7 days for further evaluation of your symptoms.    Return instructions:  SEEK IMMEDIATE MEDICAL ATTENTION IF:  The pain does not go away or becomes severe   A temperature above 101F develops   Repeated vomiting occurs (multiple episodes)   The pain becomes localized to portions of the abdomen. The right side could possibly be appendicitis. In an adult, the left lower portion of the abdomen could be colitis or diverticulitis.   Blood is being passed in stools or vomit (bright red or black tarry stools)   You develop chest pain, difficulty breathing, dizziness or fainting, or become confused, poorly responsive, or inconsolable (young children)  If you have any other emergent concerns regarding your health  Additional Information: Abdominal (belly) pain can be caused by many things. Your caregiver performed an examination and possibly ordered blood/urine tests and imaging (CT scan, x-rays, ultrasound). Many cases can be observed and treated at home after initial evaluation in the emergency department. Even though you are being discharged home, abdominal pain can be unpredictable. Therefore, you need a repeated exam if your pain does not resolve, returns, or worsens. Most patients with abdominal pain don't have to be admitted to the hospital or have surgery,  but serious problems like appendicitis and gallbladder attacks can start out as nonspecific pain. Many abdominal conditions cannot be diagnosed in one visit, so follow-up evaluations are very important.  Your vital signs today were: BP 122/77   Pulse 91   Temp(Src) 98.8 F (37.1 C) (Oral)   Resp 17   SpO2 97% If your blood pressure (bp) was elevated above 135/85 this visit, please have this repeated by your doctor within one month. --------------

## 2013-10-13 NOTE — ED Provider Notes (Signed)
CSN: [id number]     Arrival date & time 10/13/13  1023 History   First MD Initiated Contact with Patient 10/13/13 1414     Chief Complaint  Patient presents with  . Abdominal Pain     (Consider location/radiation/quality/duration/timing/severity/associated sxs/prior Treatment) HPI Comments: Patient with no previous abdominal surgeries other than C-section presents with complaint of a "knot" in her epigastric area that she has noticed for the past 3 days. She has not had any pain or tenderness in this area. She has not had any fever, nausea, vomiting, diarrhea. She has not had any worsening of the symptoms with food. The onset of this condition was acute. The course is constant. Aggravating factors: none. Alleviating factors: none.    Patient is a 28 y.o. female presenting with abdominal pain. The history is provided by the patient.  Abdominal Pain Associated symptoms: no chest pain, no cough, no diarrhea, no dysuria, no fever, no nausea, no sore throat and no vomiting     Past Medical History  Diagnosis Date  . Gonorrhea   . Chlamydia   . Anemia   . Pelvic pain   . Hydrosalpinx   . Abnormal Pap smear     ASCUS +HPV  . 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 Dysrhythmia     stress induced- none x 1 year/ "skipping"  . GERD (gastroesophageal reflux disease)     diet related   Past Surgical History  Procedure Laterality Date  . Cesarean section    . Mass excision  03/12/2012    Procedure: EXCISION MASS;  Surgeon: Shelly Rubenstein, MD;  Location: [facility];  Service: General;  Laterality: N/A;  Excision of Abdominal Wall Mass   History reviewed. No pertinent family history. History  Substance Use Topics  . Smoking status: Current Every Day Smoker -- 0.25 packs/day    Types: Cigarettes  . Smokeless tobacco: Never Used  . Alcohol Use: No   OB History   Grav Para Term Preterm Abortions TAB SAB Ect Mult Living   2 1 0 1 1  1   1      Review of  Systems  Constitutional: Negative for fever.  HENT: Negative for rhinorrhea and sore throat.   Eyes: Negative for redness.  Respiratory: Negative for cough.   Cardiovascular: Negative for chest pain.  Gastrointestinal: Negative for nausea, vomiting, abdominal pain, diarrhea and abdominal distention.       Abdominal wall mass  Genitourinary: Negative for dysuria.  Musculoskeletal: Negative for myalgias.  Skin: Negative for rash.  Neurological: Negative for headaches.   Allergies  Review of patient's allergies indicates no known allergies.  Home Medications   Prior to Admission medications   Medication Sig Start Date End Date Taking? Authorizing Provider  Thiamine HCl (VITAMIN B-1 PO) Take 1 tablet by mouth daily.   Yes Historical Provider, MD   BP 140/72  Pulse 94  Temp(Src) 98.8 F (37.1 C) (Oral)  Resp 18  SpO2 100%  Physical Exam  Nursing note and vitals reviewed. Constitutional: She appears well-developed and well-nourished.  HENT:  Head: Normocephalic and atraumatic.  Eyes: Conjunctivae are normal. Right eye exhibits no discharge. Left eye exhibits no discharge.  Neck: Normal range of motion. Neck supple.  Cardiovascular: Normal rate, regular rhythm and normal heart sounds.   No murmur heard. Pulmonary/Chest: Effort normal and breath sounds normal. No respiratory distress. She has no wheezes. She has no rales.  Abdominal: Soft. Bowel sounds  are normal. She exhibits no distension. There is no tenderness. There is no rebound and no guarding.  I do not feel any upper abdominal mass on exam, even with patient lifting shoulders off bed.   Neurological: She is alert.  Skin: Skin is warm and dry.  Psychiatric: She has a normal mood and affect.    ED Course  Procedures (including critical care time) Labs Review Labs Reviewed  CBC WITH DIFFERENTIAL - Abnormal; Notable for the following:    Hemoglobin 10.8 (*)    HCT 32.5 (*)    All other components within normal limits   URINALYSIS, ROUTINE W REFLEX MICROSCOPIC - Abnormal; Notable for the following:    APPearance CLOUDY (*)    All other components within normal limits  COMPREHENSIVE METABOLIC PANEL  LIPASE, BLOOD  POC URINE PREG, ED    Imaging Review No results found.   EKG Interpretation None      Patient seen and examined. Will check labs. Pt currently asymptomatic. If labs are okay, she can follow-up with PCP.   Vital signs reviewed and are as follows: BP 140/72  Pulse 94  Temp(Src) 98.8 F (37.1 C) (Oral)  Resp 18  SpO2 100%  4:23 PM Labs reviewed and are unremarkable. Pt informed. PCP referral given.   The patient was urged to return to the Emergency Department immediately with worsening of current symptoms, worsening abdominal pain, persistent vomiting, blood noted in stools, fever, or any other concerns. The patient verbalized understanding.    MDM   Final diagnoses:  Pain of upper abdomen   Patient with 'knot' in upper abdomen. No pain. Normal exam here. No obstructive symptoms. I feel patient can monitor and follow-up with PCP as needed.   No dangerous or life-threatening conditions suspected or identified by history, physical exam, and by work-up. No indications for hospitalization identified.      Renne CriglerJoshua Devanie Galanti, PA-C 10/13/13 1625

## 2013-10-14 NOTE — ED Provider Notes (Signed)
History/physical exam/procedure(s) were performed by non-physician practitioner and as supervising physician I was immediately available for consultation/collaboration. I have reviewed all notes and am in agreement with care and plan.   Hilario Quarryanielle S Quinlan Vollmer, MD 10/14/13 [phone]

## 2013-12-27 ENCOUNTER — Encounter (HOSPITAL_COMMUNITY): Payer: Self-pay | Admitting: Emergency Medicine

## 2014-01-10 ENCOUNTER — Encounter (HOSPITAL_COMMUNITY): Payer: Self-pay | Admitting: *Deleted

## 2014-01-10 ENCOUNTER — Emergency Department (HOSPITAL_COMMUNITY)
Admission: EM | Admit: 2014-01-10 | Discharge: 2014-01-10 | Disposition: A | Discharge status: Home / Self Care | Payer: Medicaid Other | Attending: Emergency Medicine | Admitting: Emergency Medicine

## 2014-01-10 ENCOUNTER — Other Ambulatory Visit: Payer: Self-pay | Admitting: Internal Medicine

## 2014-01-10 DIAGNOSIS — Z8719 Personal history of other diseases of the digestive system: Secondary | ICD-10-CM | POA: Insufficient documentation

## 2014-01-10 DIAGNOSIS — Z8619 Personal history of other infectious and parasitic diseases: Secondary | ICD-10-CM | POA: Insufficient documentation

## 2014-01-10 DIAGNOSIS — Z720 Tobacco use: Secondary | ICD-10-CM | POA: Diagnosis not present

## 2014-01-10 DIAGNOSIS — I499 Cardiac arrhythmia, unspecified: Secondary | ICD-10-CM | POA: Insufficient documentation

## 2014-01-10 DIAGNOSIS — N644 Mastodynia: Secondary | ICD-10-CM

## 2014-01-10 DIAGNOSIS — Z862 Personal history of diseases of the blood and blood-forming organs and certain disorders involving the immune mechanism: Secondary | ICD-10-CM | POA: Diagnosis not present

## 2014-01-10 DIAGNOSIS — R52 Pain, unspecified: Secondary | ICD-10-CM

## 2014-01-10 NOTE — ED Notes (Signed)
Pt reports having pain to left breast x 3 days.

## 2014-01-10 NOTE — Discharge Instructions (Signed)
-  Your breast pain is most likely muscular related to your job or a benign process in your breast that could be related to your menstrual cycle. -This will likely resolve on its own, but we have referred you to the [facility] to determine if an ultrasound may be helpful. -If you continue to have pain, you can take 200-400 mg of ibuprofen every six hours as needed.  You can purchase this over the counter.

## 2014-01-10 NOTE — ED Provider Notes (Signed)
CSN: [id number]     Arrival date & time 01/10/14  [id number] History   First MD Initiated Contact with Patient 01/10/14 [phone]     Chief Complaint  Patient presents with  . Breast Pain   HPI Sherry Hayes is a 28 year old G1P1 woman with history of GERD presenting with left breast pain for the last 4 days.  The pain is localized to her lateral left breast, and she has not noticed any associated mass, nipple discharge, or skin changes.  She says she first noticed the pain when she was laying on her left side, and the pain is aggravated by palpation and when pressure is applied to the location.  She denies any history of similar pain, and she says that she does not perform self breast examinations, so she would not know if she had any new lumps or breast changes.  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  She has one child who is 26 years old.  Her last monthly period was two weeks ago.  She is single and sexually active, but she is not on oral contraceptives and uses condoms for birth control.  She denies any family history of breast, uterine, or ovarian cancer.  Past Medical History  Diagnosis Date  . Gonorrhea   . Chlamydia   . Anemia   . Pelvic pain   . Hydrosalpinx   . Abnormal Pap smear     ASCUS +HPV  . 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 Dysrhythmia     stress induced- none x 1 year/ "skipping"  . GERD (gastroesophageal reflux disease)     diet related   Past Surgical History  Procedure Laterality Date  . Cesarean section    . Mass excision  03/12/2012    Procedure: EXCISION MASS;  Surgeon: Shelly Rubensteinouglas A Blackman, MD;  Location: [facility];  Service: General;  Laterality: N/A;  Excision of Abdominal Wall Mass   History reviewed. No pertinent family history. History  Substance Use Topics  . Smoking status: Current Every Day Smoker -- 0.25 packs/day    Types: Cigarettes  . Smokeless tobacco: Never Used   . Alcohol Use: Yes     Comment: every other weekend   OB History    Gravida Para Term Preterm AB TAB SAB Ectopic Multiple Living   1 1 0 1 0  0   1     Review of Systems  Constitutional: Negative for fever, chills, appetite change and unexpected weight change.  HENT: Negative for congestion and rhinorrhea.   Respiratory: Negative for apnea, cough, shortness of breath and wheezing.   Cardiovascular: Negative for chest pain and palpitations.  Gastrointestinal: Negative for nausea, diarrhea and constipation.  Genitourinary: Negative for hematuria and difficulty urinating.  Musculoskeletal: Positive for myalgias. Negative for arthralgias.  Skin: Negative for pallor and rash.  Neurological: Negative for dizziness, numbness and headaches.  Hematological: Negative for adenopathy.      Allergies  Review of patient's allergies indicates no known allergies.  Home Medications   Prior to Admission medications   Medication Sig Start Date End Date Taking? Authorizing Provider  Multiple Vitamin (MULTIVITAMIN WITH MINERALS) TABS tablet Take 1 tablet by mouth daily.   Yes Historical Provider, MD  thiamine (VITAMIN B-1) 50 MG tablet Take 50 mg by mouth daily as needed (for vitamin).   Yes Historical Provider, MD  BP 121/77 mmHg  Pulse 89  Temp(Src) 98.3 F (36.8 C) (Oral)  Resp 16  SpO2 100%  LMP 12/27/2013 Physical Exam  Constitutional: She is oriented to person, place, and time. She appears well-developed and well-nourished. No distress.  HENT:  Head: Normocephalic and atraumatic.  Mouth/Throat: No oropharyngeal exudate.  Eyes: Conjunctivae are normal. Pupils are equal, round, and reactive to light. No scleral icterus.  Neck: Normal range of motion. Neck supple.  Cardiovascular: Normal rate, regular rhythm and normal heart sounds.   Pulmonary/Chest: Effort normal and breath sounds normal. No respiratory distress.  Abdominal: Soft. Bowel sounds are normal. She exhibits no  distension. There is no tenderness.  Musculoskeletal: Normal range of motion. She exhibits tenderness (Midline outer left breast associated with mobile glandular breast tissue.  Glandular breast tissue noted bilaterally.). She exhibits no edema.  No axillary lymphadenopathy.  Neurological: She is alert and oriented to person, place, and time. No cranial nerve deficit. She exhibits normal muscle tone.  Skin: Skin is warm and dry. No rash noted. No erythema.  Psychiatric: She has a normal mood and affect.    ED Course  Procedures (including critical care time) Labs Review Labs Reviewed - No data to display  Imaging Review No results found.   MDM   Final diagnoses:  Acute breast pain   Pain in outer left breast most likely musculoskeletal from job.  No concerning associated masses or symptoms.  Could also represent fibrocystic changes related to menstrual cycle or benign fibroadenoma, cyst or fat necrosis.  Unlikely to be malignant, but [facility] or [facility]ogram to rule out inflammatory or other underlying cancer.  Can take Motrin as needed if pain continues.    Luisa DagoEverett Darleen Moffitt, MD 01/10/14 1005  Suzi RootsKevin E Steinl, MD 01/14/14 [phone]

## 2014-01-11 ENCOUNTER — Telehealth (HOSPITAL_BASED_OUTPATIENT_CLINIC_OR_DEPARTMENT_OTHER): Payer: Self-pay | Admitting: Emergency Medicine

## 2014-01-12 ENCOUNTER — Other Ambulatory Visit: Payer: Self-pay | Admitting: Physician Assistant

## 2014-01-12 ENCOUNTER — Telehealth (HOSPITAL_BASED_OUTPATIENT_CLINIC_OR_DEPARTMENT_OTHER): Payer: Self-pay | Admitting: Emergency Medicine

## 2014-01-12 ENCOUNTER — Other Ambulatory Visit: Payer: Self-pay | Admitting: Internal Medicine

## 2014-01-12 DIAGNOSIS — N644 Mastodynia: Secondary | ICD-10-CM

## 2014-01-19 ENCOUNTER — Ambulatory Visit
Admission: RE | Admit: 2014-01-19 | Discharge: 2014-01-19 | Disposition: A | Discharge status: Home / Self Care | Payer: Medicaid Other | Source: Ambulatory Visit | Attending: Physician Assistant | Admitting: Physician Assistant

## 2014-01-19 DIAGNOSIS — N644 Mastodynia: Secondary | ICD-10-CM

## 2014-03-30 ENCOUNTER — Emergency Department (HOSPITAL_COMMUNITY)
Admission: EM | Admit: 2014-03-30 | Discharge: 2014-03-30 | Disposition: A | Discharge status: Home / Self Care | Payer: Medicaid Other | Attending: Emergency Medicine | Admitting: Emergency Medicine

## 2014-03-30 ENCOUNTER — Encounter (HOSPITAL_COMMUNITY): Payer: Self-pay | Admitting: Emergency Medicine

## 2014-03-30 DIAGNOSIS — Z862 Personal history of diseases of the blood and blood-forming organs and certain disorders involving the immune mechanism: Secondary | ICD-10-CM | POA: Insufficient documentation

## 2014-03-30 DIAGNOSIS — Z3202 Encounter for pregnancy test, result negative: Secondary | ICD-10-CM | POA: Insufficient documentation

## 2014-03-30 DIAGNOSIS — Z79899 Other long term (current) drug therapy: Secondary | ICD-10-CM | POA: Insufficient documentation

## 2014-03-30 DIAGNOSIS — Z8619 Personal history of other infectious and parasitic diseases: Secondary | ICD-10-CM | POA: Insufficient documentation

## 2014-03-30 DIAGNOSIS — N390 Urinary tract infection, site not specified: Secondary | ICD-10-CM | POA: Diagnosis not present

## 2014-03-30 DIAGNOSIS — Z8719 Personal history of other diseases of the digestive system: Secondary | ICD-10-CM | POA: Insufficient documentation

## 2014-03-30 DIAGNOSIS — Z720 Tobacco use: Secondary | ICD-10-CM | POA: Insufficient documentation

## 2014-03-30 DIAGNOSIS — R350 Frequency of micturition: Secondary | ICD-10-CM | POA: Diagnosis present

## 2014-03-30 LAB — URINALYSIS, ROUTINE W REFLEX MICROSCOPIC
Bilirubin Urine: NEGATIVE
GLUCOSE, UA: NEGATIVE mg/dL
HGB URINE DIPSTICK: NEGATIVE
Ketones, ur: NEGATIVE mg/dL
NITRITE: NEGATIVE
PH: 8 (ref 5.0–8.0)
Protein, ur: NEGATIVE mg/dL
Specific Gravity, Urine: 1.021 (ref 1.005–1.030)
Urobilinogen, UA: 1 mg/dL (ref 0.0–1.0)

## 2014-03-30 LAB — URINE MICROSCOPIC-ADD ON

## 2014-03-30 LAB — POC URINE PREG, ED: Preg Test, Ur: NEGATIVE

## 2014-03-30 MED ORDER — CEPHALEXIN 500 MG PO CAPS: 500.0000 mg | ORAL_CAPSULE | Freq: Two times a day (BID) | ORAL | Status: DC

## 2014-03-30 NOTE — Discharge Instructions (Signed)

## 2014-03-30 NOTE — ED Provider Notes (Signed)
CSN: [id number]     Arrival date & time 03/30/14  2000 History   First MD Initiated Contact with Patient 03/30/14 2031     Chief Complaint  Patient presents with  . Urinary Frequency     (Consider location/radiation/quality/duration/timing/severity/associated sxs/prior Treatment) HPI Comments: 29 yo female presenting with urinary frequency and urgency.    Patient is a 29 y.o. female presenting with female genitourinary complaint.  Female GU Problem This is a new problem. The current episode started yesterday. The problem occurs constantly. The problem has not changed since onset.Pertinent negatives include no chest pain and no abdominal pain. Associated symptoms comments: No fevers, no back pain. No dysuria.  No vaginal discharge or bleeding.. Nothing aggravates the symptoms. Nothing relieves the symptoms. Treatments tried: cranberry juice.    Past Medical History  Diagnosis Date  . Gonorrhea   . Chlamydia   . Anemia   . Pelvic pain   . Hydrosalpinx   . Abnormal Pap smear     ASCUS +HPV  . 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 Dysrhythmia     stress induced- none x 1 year/ "skipping"  . GERD (gastroesophageal reflux disease)     diet related   Past Surgical History  Procedure Laterality Date  . Cesarean section    . Mass excision  03/12/2012    Procedure: EXCISION MASS;  Surgeon: Shelly Rubensteinouglas A Blackman, MD;  Location: [facility];  Service: General;  Laterality: N/A;  Excision of Abdominal Wall Mass   History reviewed. No pertinent family history. History  Substance Use Topics  . Smoking status: Current Every Day Smoker -- 0.25 packs/day    Types: Cigarettes  . Smokeless tobacco: Never Used  . Alcohol Use: Yes     Comment: every other weekend   OB History    Gravida Para Term Preterm AB TAB SAB Ectopic Multiple Living   1 1 0 1 0  0   1     Review of Systems  Cardiovascular: Negative for chest pain.  Gastrointestinal: Negative for abdominal pain.   All other systems reviewed and are negative.     Allergies  Review of patient's allergies indicates no known allergies.  Home Medications   Prior to Admission medications   Medication Sig Start Date End Date Taking? Authorizing Provider  Multiple Vitamin (MULTIVITAMIN WITH MINERALS) TABS tablet Take 1 tablet by mouth daily.    Historical Provider, MD  thiamine (VITAMIN B-1) 50 MG tablet Take 50 mg by mouth daily as needed (for vitamin).    Historical Provider, MD   BP 131/73 mmHg  Pulse 96  Temp(Src) 98.1 F (36.7 C) (Oral)  Resp 14  SpO2 97%  LMP 03/19/2014 (Exact Date) Physical Exam  Constitutional: She is oriented to person, place, and time. She appears well-developed and well-nourished. No distress.  HENT:  Head: Normocephalic and atraumatic.  Eyes: Conjunctivae are normal. No scleral icterus.  Neck: Neck supple.  Cardiovascular: Normal rate and intact distal pulses.   Pulmonary/Chest: Effort normal. No stridor. No respiratory distress.  Abdominal: Normal appearance. She exhibits no distension. There is no tenderness. There is no rebound, no guarding and no CVA tenderness.  Neurological: She is alert and oriented to person, place, and time.  Skin: Skin is warm and dry. No rash noted.  Psychiatric: She has a normal mood and affect. Her behavior is normal.  Nursing note and vitals reviewed.   ED Course  Procedures (including critical care time)  Labs Review Labs Reviewed  URINALYSIS, ROUTINE W REFLEX MICROSCOPIC - Abnormal; Notable for the following:    APPearance TURBID (*)    Leukocytes, UA LARGE (*)    All other components within normal limits  URINE MICROSCOPIC-ADD ON - Abnormal; Notable for the following:    Bacteria, UA MANY (*)    All other components within normal limits  URINE CULTURE  POC URINE PREG, ED    Imaging Review No results found.   EKG Interpretation None      MDM   Final diagnoses:  UTI (lower urinary tract infection)    29 yo  with urinary frequency and urgency. No other symptoms. Well appearing.  UA shows UTI.  Treat with Keflex.     Candyce Churn III, MD 03/30/14 2138

## 2014-03-30 NOTE — ED Notes (Signed)
Pt states she thinks she has a UTI  Pt c/o urinary frequency

## 2014-04-01 LAB — URINE CULTURE: Colony Count: 70000

## 2014-04-04 ENCOUNTER — Telehealth (HOSPITAL_COMMUNITY): Payer: Self-pay

## 2014-04-04 NOTE — Telephone Encounter (Signed)
Post ED Visit - Positive Culture Follow-up  Post ED Visit - Positive Culture Follow-up  Culture report reviewed by antimicrobial stewardship pharmacist: []  Wes Dulaney, Pharm.D., BCPS []  Celedonio MiyamotoJeremy Frens, Pharm.D., BCPS []  Georgina PillionElizabeth Martin, Pharm.D., BCPS [x]  RobinsonMinh Pham, [address]Ph[address] BCPS, AAHIVP []  Estella HuskMichelle Turner, Pharm.D., BCPS, AAHIVP []  Elder CyphersLorie Poole, [address]Ph[address]D., BCPS  Positive Urine culture, 70,000 colonies -> E Coli Treated with Cephalexin, organism sensitive to the same and no further patient follow-up is required at this time.  Arvid RightClark, Katalia Choma Dorn 04/04/2014, 5:08 AM

## 2014-04-28 ENCOUNTER — Emergency Department (HOSPITAL_COMMUNITY): Payer: Medicaid Other

## 2014-04-28 ENCOUNTER — Emergency Department (HOSPITAL_COMMUNITY)
Admission: EM | Admit: 2014-04-28 | Discharge: 2014-04-28 | Disposition: A | Discharge status: Home / Self Care | Payer: Medicaid Other | Attending: Emergency Medicine | Admitting: Emergency Medicine

## 2014-04-28 ENCOUNTER — Encounter (HOSPITAL_COMMUNITY): Payer: Self-pay

## 2014-04-28 DIAGNOSIS — Z3202 Encounter for pregnancy test, result negative: Secondary | ICD-10-CM | POA: Diagnosis not present

## 2014-04-28 DIAGNOSIS — Z862 Personal history of diseases of the blood and blood-forming organs and certain disorders involving the immune mechanism: Secondary | ICD-10-CM | POA: Insufficient documentation

## 2014-04-28 DIAGNOSIS — R0789 Other chest pain: Secondary | ICD-10-CM | POA: Diagnosis not present

## 2014-04-28 DIAGNOSIS — Z792 Long term (current) use of antibiotics: Secondary | ICD-10-CM | POA: Diagnosis not present

## 2014-04-28 DIAGNOSIS — Z720 Tobacco use: Secondary | ICD-10-CM | POA: Diagnosis not present

## 2014-04-28 DIAGNOSIS — Z8679 Personal history of other diseases of the circulatory system: Secondary | ICD-10-CM | POA: Diagnosis not present

## 2014-04-28 DIAGNOSIS — Z8619 Personal history of other infectious and parasitic diseases: Secondary | ICD-10-CM | POA: Insufficient documentation

## 2014-04-28 DIAGNOSIS — Z87448 Personal history of other diseases of urinary system: Secondary | ICD-10-CM | POA: Insufficient documentation

## 2014-04-28 DIAGNOSIS — R079 Chest pain, unspecified: Secondary | ICD-10-CM | POA: Diagnosis present

## 2014-04-28 DIAGNOSIS — E669 Obesity, unspecified: Secondary | ICD-10-CM | POA: Diagnosis not present

## 2014-04-28 DIAGNOSIS — Z8719 Personal history of other diseases of the digestive system: Secondary | ICD-10-CM | POA: Insufficient documentation

## 2014-04-28 LAB — I-STAT CHEM 8, ED
BUN: 10 mg/dL (ref 6–23)
CALCIUM ION: 1.19 mmol/L (ref 1.12–1.23)
CHLORIDE: 103 mmol/L (ref 96–112)
CREATININE: 0.8 mg/dL (ref 0.50–1.10)
GLUCOSE: 96 mg/dL (ref 70–99)
HCT: 34 % — ABNORMAL LOW (ref 36.0–46.0)
Hemoglobin: 11.6 g/dL — ABNORMAL LOW (ref 12.0–15.0)
POTASSIUM: 3.9 mmol/L (ref 3.5–5.1)
Sodium: 138 mmol/L (ref 135–145)
TCO2: 21 mmol/L (ref 0–100)

## 2014-04-28 LAB — CBC WITH DIFFERENTIAL/PLATELET
Basophils Absolute: 0 10*3/uL (ref 0.0–0.1)
Basophils Relative: 0 % (ref 0–1)
EOS PCT: 1 % (ref 0–5)
Eosinophils Absolute: 0.1 10*3/uL (ref 0.0–0.7)
HCT: 32.4 % — ABNORMAL LOW (ref 36.0–46.0)
Hemoglobin: 10.5 g/dL — ABNORMAL LOW (ref 12.0–15.0)
LYMPHS ABS: 1.8 10*3/uL (ref 0.7–4.0)
Lymphocytes Relative: 41 % (ref 12–46)
MCH: 26.6 pg (ref 26.0–34.0)
MCHC: 32.4 g/dL (ref 30.0–36.0)
MCV: 82 fL (ref 78.0–100.0)
Monocytes Absolute: 0.3 10*3/uL (ref 0.1–1.0)
Monocytes Relative: 7 % (ref 3–12)
NEUTROS PCT: 51 % (ref 43–77)
Neutro Abs: 2.3 10*3/uL (ref 1.7–7.7)
Platelets: 313 10*3/uL (ref 150–400)
RBC: 3.95 MIL/uL (ref 3.87–5.11)
RDW: 15.9 % — ABNORMAL HIGH (ref 11.5–15.5)
WBC: 4.5 10*3/uL (ref 4.0–10.5)

## 2014-04-28 LAB — I-STAT TROPONIN, ED: TROPONIN I, POC: 0 ng/mL (ref 0.00–0.08)

## 2014-04-28 LAB — POC URINE PREG, ED: PREG TEST UR: NEGATIVE

## 2014-04-28 MED ORDER — OMEPRAZOLE 20 MG PO CPDR: 20.0000 mg | DELAYED_RELEASE_CAPSULE | Freq: Every day | ORAL | Status: DC

## 2014-04-28 MED ORDER — FAMOTIDINE 20 MG PO TABS: 20.0000 mg | ORAL_TABLET | Freq: Two times a day (BID) | ORAL | Status: DC

## 2014-04-28 MED ORDER — IBUPROFEN 400 MG PO TABS: 400.0000 mg | ORAL_TABLET | Freq: Once | ORAL | Status: DC

## 2014-04-28 MED ORDER — GI COCKTAIL ~~LOC~~
30.0000 mL | Freq: Once | ORAL | Status: AC
  Administered 2014-04-28: 30 mL via ORAL
  Filled 2014-04-28: qty 30

## 2014-04-28 NOTE — Discharge Instructions (Signed)
Your chest discomfort is likely related to heart burn.  Take pepcid and prilosec 30 minutes before meal as prescribed. Return if your condition worsen or if you have other concerns.  Chest Pain (Nonspecific) It is often hard to give a specific diagnosis for the cause of chest pain. There is always a chance that your pain could be related to something serious, such as a heart attack or a blood clot in the lungs. You need to follow up with your health care provider for further evaluation. CAUSES   Heartburn.  Pneumonia or bronchitis.  Anxiety or stress.  Inflammation around your heart (pericarditis) or lung (pleuritis or pleurisy).  A blood clot in the lung.  A collapsed lung (pneumothorax). It can develop suddenly on its own (spontaneous pneumothorax) or from trauma to the chest.  Shingles infection (herpes zoster virus). The chest wall is composed of bones, muscles, and cartilage. Any of these can be the source of the pain.  The bones can be bruised by injury.  The muscles or cartilage can be strained by coughing or overwork.  The cartilage can be affected by inflammation and become sore (costochondritis). DIAGNOSIS  Lab tests or other studies may be needed to find the cause of your pain. Your health care provider may have you take a test called an ambulatory electrocardiogram (ECG). An ECG records your heartbeat patterns over a 24-hour period. You may also have other tests, such as:  Transthoracic echocardiogram (TTE). During echocardiography, sound waves are used to evaluate how blood flows through your heart.  Transesophageal echocardiogram (TEE).  Cardiac monitoring. This allows your health care provider to monitor your heart rate and rhythm in real time.  Holter monitor. This is a portable device that records your heartbeat and can help diagnose heart arrhythmias. It allows your health care provider to track your heart activity for several days, if needed.  Stress tests by  exercise or by giving medicine that makes the heart beat faster. TREATMENT   Treatment depends on what may be causing your chest pain. Treatment may include:  Acid blockers for heartburn.  Anti-inflammatory medicine.  Pain medicine for inflammatory conditions.  Antibiotics if an infection is present.  You may be advised to change lifestyle habits. This includes stopping smoking and avoiding alcohol, caffeine, and chocolate.  You may be advised to keep your head raised (elevated) when sleeping. This reduces the chance of acid going backward from your stomach into your esophagus. Most of the time, nonspecific chest pain will improve within 2-3 days with rest and mild pain medicine.  HOME CARE INSTRUCTIONS   If antibiotics were prescribed, take them as directed. Finish them even if you start to feel better.  For the next few days, avoid physical activities that bring on chest pain. Continue physical activities as directed.  Do not use any tobacco products, including cigarettes, chewing tobacco, or electronic cigarettes.  Avoid drinking alcohol.  Only take medicine as directed by your health care provider.  Follow your health care provider's suggestions for further testing if your chest pain does not go away.  Keep any follow-up appointments you made. If you do not go to an appointment, you could develop lasting (chronic) problems with pain. If there is any problem keeping an appointment, call to reschedule. SEEK MEDICAL CARE IF:   Your chest pain does not go away, even after treatment.  You have a rash with blisters on your chest.  You have a fever. SEEK IMMEDIATE MEDICAL CARE IF:  You have increased chest pain or pain that spreads to your arm, neck, jaw, back, or abdomen.  You have shortness of breath.  You have an increasing cough, or you cough up blood.  You have severe back or abdominal pain.  You feel nauseous or vomit.  You have severe weakness.  You  faint.  You have chills. This is an emergency. Do not wait to see if the pain will go away. Get medical help at once. Call your local emergency services (911 in [place] Do not drive yourself to the hospital. MAKE SURE YOU:   Understand these instructions.  Will watch your condition.  Will get help right away if you are not doing well or get worse. Document Released: 11/21/2004 Document Revised: 02/16/2013 Document Reviewed: 09/17/2007 [facility] Patient Information 2015 [place], [place]. This information is not intended to replace advice given to you by your health care provider. Make sure you discuss any questions you have with your health care provider. Gastroesophageal Reflux Disease, Adult Gastroesophageal reflux disease (GERD) happens when acid from your stomach flows up into the esophagus. When acid comes in contact with the esophagus, the acid causes soreness (inflammation) in the esophagus. Over time, GERD may create small holes (ulcers) in the lining of the esophagus. CAUSES   Increased body weight. This puts pressure on the stomach, making acid rise from the stomach into the esophagus.  Smoking. This increases acid production in the stomach.  Drinking alcohol. This causes decreased pressure in the lower esophageal sphincter (valve or ring of muscle between the esophagus and stomach), allowing acid from the stomach into the esophagus.  Late evening meals and a full stomach. This increases pressure and acid production in the stomach.  A malformed lower esophageal sphincter. Sometimes, no cause is found. SYMPTOMS   Burning pain in the lower part of the mid-chest behind the breastbone and in the mid-stomach area. This may occur twice a week or more often.  Trouble swallowing.  Sore throat.  Dry cough.  Asthma-like symptoms including chest tightness, shortness of breath, or wheezing. DIAGNOSIS  Your caregiver may be able to diagnose GERD based on your symptoms. In some cases,  X-rays and other tests may be done to check for complications or to check the condition of your stomach and esophagus. TREATMENT  Your caregiver may recommend over-the-counter or prescription medicines to help decrease acid production. Ask your caregiver before starting or adding any new medicines.  HOME CARE INSTRUCTIONS   Change the factors that you can control. Ask your caregiver for guidance concerning weight loss, quitting smoking, and alcohol consumption.  Avoid foods and drinks that make your symptoms worse, such as:  Caffeine or alcoholic drinks.  Chocolate.  Peppermint or mint flavorings.  Garlic and onions.  Spicy foods.  Citrus fruits, such as oranges, lemons, or limes.  Tomato-based foods such as sauce, chili, salsa, and pizza.  Fried and fatty foods.  Avoid lying down for the 3 hours prior to your bedtime or prior to taking a nap.  Eat small, frequent meals instead of large meals.  Wear loose-fitting clothing. Do not wear anything tight around your waist that causes pressure on your stomach.  Raise the head of your bed 6 to 8 inches with wood blocks to help you sleep. Extra pillows will not help.  Only take over-the-counter or prescription medicines for pain, discomfort, or fever as directed by your caregiver.  Do not take aspirin, ibuprofen, or other nonsteroidal anti-inflammatory drugs (NSAIDs). SEEK IMMEDIATE MEDICAL CARE  IF:   You have pain in your arms, neck, jaw, teeth, or back.  Your pain increases or changes in intensity or duration.  You develop nausea, vomiting, or sweating (diaphoresis).  You develop shortness of breath, or you faint.  Your vomit is green, yellow, black, or looks like coffee grounds or blood.  Your stool is red, bloody, or black. These symptoms could be signs of other problems, such as heart disease, gastric bleeding, or esophageal bleeding. MAKE SURE YOU:   Understand these instructions.  Will watch your  condition.  Will get help right away if you are not doing well or get worse. Document Released: 11/21/2004 Document Revised: 05/06/2011 Document Reviewed: 08/31/2010 [facility] Patient Information 2015 [place], [place]. This information is not intended to replace advice given to you by your health care provider. Make sure you discuss any questions you have with your health care provider.

## 2014-04-28 NOTE — ED Provider Notes (Signed)
CSN: [id number]     Arrival date & time 04/28/14  0920 History   First MD Initiated Contact with Patient 04/28/14 [id number]     No chief complaint on file.    (Consider location/radiation/quality/duration/timing/severity/associated sxs/prior Treatment) HPI   29 year old female with history of GERD, dysrhythmia, anemia, STI presenting to be evaluate for chest pain. Patient reports she has had intermittent mid central chest pain for the past month. She described pain as an achy sensation, nonradiating, mild to moderate, rated as a 5 out of 10, persistent lasting throughout the day, sometimes worsening with lifting a patient. Pain is not associated with lightheadedness, dizziness, diaphoresis, or shortness of breath. She denies any nausea vomiting diarrhea abdominal pain back pain dysuria or rash. She denies any strenuous activities. She is a smoker but denies any significant family history of cardiac disease, no premature cardiac death. There is no associated dyspnea or exertion, no significant risk factor for PE. Pain has been persistent for nearly a week but she did not have any available time to come to the doctor. She does have history of GERD and has daytime sometimes helps. She is unable to differentiate between this pain and her GERD pain. Her last menstrual period was over a week ago.  Past Medical History  Diagnosis Date  . Gonorrhea   . Chlamydia   . Anemia   . Pelvic pain   . Hydrosalpinx   . Abnormal Pap smear     ASCUS +HPV  . 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 Dysrhythmia     stress induced- none x 1 year/ "skipping"  . GERD (gastroesophageal reflux disease)     diet related   Past Surgical History  Procedure Laterality Date  . Cesarean section    . Mass excision  03/12/2012    Procedure: EXCISION MASS;  Surgeon: Shelly Rubenstein, MD;  Location: [facility];  Service: General;  Laterality: N/A;  Excision of Abdominal Wall Mass   No family history on  file. History  Substance Use Topics  . Smoking status: Current Every Day Smoker -- 0.25 packs/day    Types: Cigarettes  . Smokeless tobacco: Never Used  . Alcohol Use: Yes     Comment: every other weekend   OB History    Gravida Para Term Preterm AB TAB SAB Ectopic Multiple Living   1 1 0 1 0  0   1     Review of Systems  All other systems reviewed and are negative.     Allergies  Review of patient's allergies indicates no known allergies.  Home Medications   Prior to Admission medications   Medication Sig Start Date End Date Taking? Authorizing Provider  cephALEXin (KEFLEX) 500 MG capsule Take 1 capsule (500 mg total) by mouth 2 (two) times daily. 03/30/14   Candyce Churn III, MD  thiamine (VITAMIN B-1) 50 MG tablet Take 50 mg by mouth daily as needed (for vitamin).    Historical Provider, MD   LMP 03/19/2014 (Exact Date) Physical Exam  Constitutional: She appears well-developed and well-nourished. No distress.  Obese African-American female appears to be in no acute distress. Awake, alert, nontoxic appearance  HENT:  Head: Atraumatic.  Eyes: Conjunctivae are normal. Right eye exhibits no discharge. Left eye exhibits no discharge.  Neck: Neck supple.  Cardiovascular: Normal rate and regular rhythm.   Pulmonary/Chest: Effort normal. No respiratory distress. She exhibits no tenderness.  Abdominal: Soft. There is no tenderness.  There is no rebound.  Musculoskeletal: She exhibits no tenderness.  Skin: No rash noted.  Psychiatric: She has a normal mood and affect.  Nursing note and vitals reviewed.   ED Course  Procedures (including critical care time)  9:39 AM Patient presents with central chest pain, atypical for ACS. PERC negative, doubt PE.  No significant chest wall discomfort on exam. History of GERD, suspect this is related to GERD or possibly muscular skeletal. Screening exam performed. GI cocktail given.  12:20 PM Report resolution of sxs after receiving  GI cocktail.  HEART score 1, low risk for ACS.  Recommend pepcid/prilosec and instruct to avoid spicy food, eating late, etc... Return precaution discussed.    Labs Review Labs Reviewed  CBC WITH DIFFERENTIAL/PLATELET - Abnormal; Notable for the following:    Hemoglobin 10.5 (*)    HCT 32.4 (*)    RDW 15.9 (*)    All other components within normal limits  I-STAT CHEM 8, ED - Abnormal; Notable for the following:    Hemoglobin 11.6 (*)    HCT 34.0 (*)    All other components within normal limits  I-STAT TROPOININ, ED  POC URINE PREG, ED    Imaging Review Dg Chest 2 View  04/28/2014   CLINICAL DATA:  Intermittent chest pain for 1 month  EXAM: CHEST  2 VIEW  COMPARISON:  05/18/2012  FINDINGS: Cardiomediastinal silhouette is stable. No acute infiltrate or pleural effusion. No pulmonary edema. Bony thorax is unremarkable.  IMPRESSION: No active cardiopulmonary disease.   Electronically Signed   By: Natasha MeadLiviu  Pop M.D.   On: 04/28/2014 10:52     EKG Interpretation   Date/Time:  Thursday April 28 2014 09:27:26 EST Ventricular Rate:  89 PR Interval:  145 QRS Duration: 75 QT Interval:  340 QTC Calculation: 414 R Axis:   73 Text Interpretation:  Sinus rhythm No significant change since last  tracing Confirmed by KNAPP  MD-J, JON ([id number]) on 04/28/2014 9:33:07 AM      MDM   Final diagnoses:  Chest pain  Atypical chest pain    BP 105/67 mmHg  Pulse 90  Temp(Src) 98.5 F (36.9 C) (Oral)  Resp 18  SpO2 100%  LMP 03/19/2014 (LMP Unknown)  I have reviewed nursing notes and vital signs. I personally reviewed the imaging tests through PACS system  I reviewed available ER/hospitalization records thought the EMR     Fayrene HelperBowie Airyana Sprunger, PA-C 04/28/14 1221  Linwood DibblesJon Knapp, MD 04/28/14 1224

## 2014-04-28 NOTE — ED Notes (Signed)
Pt reports a tender pain substernal x 1 month intermittent. Pt reports pain is worse when she lifts. Pt denies radiation, sob, n/v, dizziness and weakness. Pt states she does have a hx of heartburn.

## 2014-12-07 ENCOUNTER — Encounter (HOSPITAL_COMMUNITY): Payer: Self-pay | Admitting: Family Medicine

## 2014-12-07 ENCOUNTER — Emergency Department (HOSPITAL_COMMUNITY)
Admission: EM | Admit: 2014-12-07 | Discharge: 2014-12-07 | Disposition: A | Discharge status: Home / Self Care | Payer: Medicaid Other | Attending: Emergency Medicine | Admitting: Emergency Medicine

## 2014-12-07 ENCOUNTER — Emergency Department (HOSPITAL_COMMUNITY): Payer: Medicaid Other

## 2014-12-07 DIAGNOSIS — Z87448 Personal history of other diseases of urinary system: Secondary | ICD-10-CM | POA: Insufficient documentation

## 2014-12-07 DIAGNOSIS — R011 Cardiac murmur, unspecified: Secondary | ICD-10-CM | POA: Insufficient documentation

## 2014-12-07 DIAGNOSIS — Z8679 Personal history of other diseases of the circulatory system: Secondary | ICD-10-CM | POA: Diagnosis not present

## 2014-12-07 DIAGNOSIS — O9989 Other specified diseases and conditions complicating pregnancy, childbirth and the puerperium: Secondary | ICD-10-CM | POA: Insufficient documentation

## 2014-12-07 DIAGNOSIS — Z3A10 10 weeks gestation of pregnancy: Secondary | ICD-10-CM | POA: Insufficient documentation

## 2014-12-07 DIAGNOSIS — Z8619 Personal history of other infectious and parasitic diseases: Secondary | ICD-10-CM | POA: Diagnosis not present

## 2014-12-07 DIAGNOSIS — O4690 Antepartum hemorrhage, unspecified, unspecified trimester: Secondary | ICD-10-CM

## 2014-12-07 DIAGNOSIS — Z862 Personal history of diseases of the blood and blood-forming organs and certain disorders involving the immune mechanism: Secondary | ICD-10-CM | POA: Insufficient documentation

## 2014-12-07 DIAGNOSIS — O99611 Diseases of the digestive system complicating pregnancy, first trimester: Secondary | ICD-10-CM | POA: Insufficient documentation

## 2014-12-07 DIAGNOSIS — O209 Hemorrhage in early pregnancy, unspecified: Secondary | ICD-10-CM

## 2014-12-07 DIAGNOSIS — K219 Gastro-esophageal reflux disease without esophagitis: Secondary | ICD-10-CM | POA: Diagnosis not present

## 2014-12-07 DIAGNOSIS — Z79899 Other long term (current) drug therapy: Secondary | ICD-10-CM | POA: Insufficient documentation

## 2014-12-07 LAB — HCG, QUANTITATIVE, PREGNANCY: hCG, Beta Chain, Quant, S: 11293 m[IU]/mL — ABNORMAL HIGH (ref <5)

## 2014-12-07 LAB — I-STAT BETA HCG BLOOD, ED (MC, WL, AP ONLY): I-stat hCG, quantitative: >2000 m[IU]/mL — ABNORMAL HIGH (ref <5)

## 2014-12-07 LAB — WET PREP, GENITAL
CLUE CELLS WET PREP: NONE SEEN
Trich, Wet Prep: NONE SEEN
YEAST WET PREP: NONE SEEN

## 2014-12-07 LAB — ABO/RH: ABO/RH(D): A POS

## 2014-12-07 NOTE — ED Notes (Signed)
Patient is currently in ultrasound 

## 2014-12-07 NOTE — ED Notes (Signed)
Pt here for possible miscarriage. sts she was having cramping and bleeding and then she pulled tissue out of her.

## 2014-12-07 NOTE — Discharge Instructions (Signed)
Your ultrasound today shows no pregnancy in the uterus. You may have passed the pregnancy but to be sure we will need for you to follow up at [facility] in 2 days to repeat the blood work. Go there sooner for worsening symptoms.   Vaginal Bleeding During Pregnancy, First Trimester A small amount of bleeding (spotting) from the vagina is common in early pregnancy. Sometimes the bleeding is normal and is not a problem, and sometimes it is a sign of something serious. Be sure to tell your doctor about any bleeding from your vagina right away. HOME CARE  Watch your condition for any changes.  Follow your doctor's instructions about how active you can be.  If you are on bed rest:  You may need to stay in bed and only get up to use the bathroom.  You may be allowed to do some activities.  If you need help, make plans for someone to help you.  Write down:  The number of pads you use each day.  How often you change pads.  How soaked (saturated) your pads are.  Do not use tampons.  Do not douche.  Do not have sex or orgasms until your doctor says it is okay.  If you pass any tissue from your vagina, save the tissue so you can show it to your doctor.  Only take medicines as told by your doctor.  Do not take aspirin because it can make you bleed.  Keep all follow-up visits as told by your doctor. GET HELP IF:   You bleed from your vagina.  You have cramps.  You have labor pains.  You have a fever that does not go away after you take medicine. GET HELP RIGHT AWAY IF:   You have very bad cramps in your back or belly (abdomen).  You pass large clots or tissue from your vagina.  You bleed more.  You feel light-headed or weak.  You pass out (faint).  You have chills.  You are leaking fluid or have a gush of fluid from your vagina.  You pass out while pooping (having a bowel movement). MAKE SURE YOU:  Understand these instructions.  Will watch your condition.  Will  get help right away if you are not doing well or get worse.   This information is not intended to replace advice given to you by your health care provider. Make sure you discuss any questions you have with your health care provider.   Document Released: 06/28/2013 Document Reviewed: 06/28/2013 Elsevier Interactive Patient Education [employer]

## 2014-12-07 NOTE — ED Notes (Signed)
Called blood bank to verify that patient only needs a pink top, no blood bank bracelet or pink slip is required.  [facility]  Info obtained via Clydie BraunKaren in the lab

## 2014-12-07 NOTE — ED Provider Notes (Signed)
CSN: [id number]     Arrival date & time 12/07/14  1212 History  By signing my name below, I, Tanda Rockers, attest that this documentation has been prepared under the direction and in the presence of Kerrie Buffalo, NP. Electronically Signed: Tanda Rockers, ED Scribe. 12/07/2014. 1:59 PM.  Chief Complaint  Patient presents with  . Threatened Miscarriage   Patient is a 29 y.o. female presenting with vaginal bleeding. The history is provided by the patient. No language interpreter was used.  Vaginal Bleeding Quality:  Passed tissue Onset quality:  Sudden Timing:  Rare Progression:  Resolved Chronicity:  New Context: at rest   Relieved by:  None tried Worsened by:  Nothing tried Ineffective treatments:  None tried Associated symptoms: abdominal pain   Associated symptoms: no dysuria, no fever, no nausea and no vaginal discharge   Risk factors: does not have multiple partners, no new sexual partner and no prior miscarriage      HPI Comments: Sherry Hayes is a 29 y.o. female who presents to the Emergency Department complaining of possible threatened miscarriage. Pt states that during the month of August and September she was having intermittent vaginal spotting. She then had a normal menstrual cycle on Friday, 12/02/2014 (approximately 6 days ago)  Pt states that a couple of days ago she began having sudden onset, suprapubic cramping. She notes that today she was taking a bath when she felt something in her vaginal canal and pulled a big clot of tissue and blood out of her vagina. She is unsure whether or not she is actually pregnant. She denies nausea, vomiting, fever, chills, frequency, dysuria, or any other associated symptoms. Pt has hx of chlamydia. She has been with her current sexual partner for the past 6 years. G1P1A0 with hx of complications with previous pregnancy.   Past Medical History  Diagnosis Date  . Gonorrhea   . Chlamydia   . Anemia   . Pelvic pain   . Hydrosalpinx   .  Abnormal Pap smear     ASCUS +HPV  . 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 Dysrhythmia     stress induced- none x 1 year/ "skipping"  . GERD (gastroesophageal reflux disease)     diet related   Past Surgical History  Procedure Laterality Date  . Cesarean section    . Mass excision  03/12/2012    Procedure: EXCISION MASS;  Surgeon: Shelly Rubenstein, MD;  Location: [facility];  Service: General;  Laterality: N/A;  Excision of Abdominal Wall Mass   History reviewed. No pertinent family history. Social History  Substance Use Topics  . Smoking status: Current Every Day Smoker -- 0.25 packs/day    Types: Cigarettes  . Smokeless tobacco: Never Used  . Alcohol Use: Yes     Comment: every other weekend   OB History    Gravida Para Term Preterm AB TAB SAB Ectopic Multiple Living   1 1 0 1 0  0   1     Review of Systems  Constitutional: Negative for fever and chills.  Gastrointestinal: Positive for abdominal pain. Negative for nausea and vomiting.  Genitourinary: Positive for vaginal bleeding. Negative for dysuria, frequency and vaginal discharge.  All other systems reviewed and are negative.  Allergies  Review of patient's allergies indicates no known allergies.  Home Medications   Prior to Admission medications   Medication Sig Start Date End Date Taking? Authorizing Provider  famotidine (PEPCID) 20 MG  tablet Take 1 tablet (20 mg total) by mouth 2 (two) times daily. 04/28/14   Fayrene Helper, PA-C  omeprazole (PRILOSEC) 20 MG capsule Take 1 capsule (20 mg total) by mouth daily. 04/28/14   Fayrene Helper, PA-C   Triage Vitals: BP 137/69 mmHg  Pulse 101  Temp(Src) 99.5 F (37.5 C)  Resp 18  SpO2 98%  LMP 10/05/2014   Physical Exam  Constitutional: She is oriented to person, place, and time. She appears well-developed and well-nourished. She appears distressed.  HENT:  Head: Normocephalic and atraumatic.  Eyes: Conjunctivae and EOM are normal.  Neck: Normal  range of motion. Neck supple.  Cardiovascular: Normal rate and regular rhythm.   Murmur heard. Pulmonary/Chest: Effort normal and breath sounds normal. No respiratory distress. She has no decreased breath sounds. She has no rhonchi.  Abdominal: Soft. Bowel sounds are normal. There is no tenderness. There is no CVA tenderness.  Genitourinary:  External genitalia without lesions, moderate blood vaginal vault. Cervix fingertip, no tenderness, uterus slightly enlarged.   Musculoskeletal: Normal range of motion.  Neurological: She is alert and oriented to person, place, and time.  Skin: Skin is warm and dry.  Psychiatric: She has a normal mood and affect. Her behavior is normal.  Nursing note and vitals reviewed.   ED Course  Procedures (including critical care time)  DIAGNOSTIC STUDIES: Oxygen Saturation is 98% on RA, normal by my interpretation.    COORDINATION OF CARE: 1:40 PM-Discussed treatment plan which includes Ultrasound OB with pt at bedside and pt agreed to plan.  Results for orders placed or performed during the hospital encounter of 12/07/14 (from the past 24 hour(s))  hCG, quantitative, pregnancy     Status: Abnormal   Collection Time: 12/07/14 12:35 PM  Result Value Ref Range   hCG, Beta Chain, Quant, S 11293 (H) <5 mIU/mL  I-Stat beta hCG blood, ED ([facility], [facility], [facility] only)     Status: Abnormal   Collection Time: 12/07/14 12:40 PM  Result Value Ref Range   I-stat hCG, quantitative >2000.0 (H) <5 mIU/mL   Comment 3          Wet prep, genital     Status: Abnormal   Collection Time: 12/07/14  2:30 PM  Result Value Ref Range   Yeast Wet Prep HPF POC NONE SEEN NONE SEEN   Trich, Wet Prep NONE SEEN NONE SEEN   Clue Cells Wet Prep HPF POC NONE SEEN NONE SEEN   WBC, Wet Prep HPF POC FEW (A) NONE SEEN  ABO/Rh     Status: None   Collection Time: 12/07/14  4:19 PM  Result Value Ref Range   ABO/RH(D) A POS    No rh immune globuloin NOT A RH IMMUNE GLOBULIN CANDIDATE, PT RH POSITIVE      Imaging Review US Ob Comp Less 14 Wks  12/07/2014  CLINICAL DATA:  Vaginal bleeding with positive beta HCG EXAM: OBSTETRIC <14 WK [place] AND TRANSVAGINAL OB US TECHNIQUE: Both transabdominal and transvaginal ultrasound examinations were performed for complete evaluation of the gestation as well as the maternal uterus, adnexal regions, and pelvic cul-de-sac. Transvaginal technique was performed to assess early pregnancy. COMPARISON:  None. FINDINGS: Intrauterine gestational sac: Not visualized Yolk sac:  Not visualized Embryo:  Not visualized Cardiac Activity: Not visualized Maternal uterus/adnexae: Uterus measures 9.3 x 6.4 x 7.2 cm. There is no intrauterine gestation. Within the uterus, there is a 3.0 x 2.3 x 2.5 cm leiomyoma. A small leiomyoma is also present measuring 1.7  x 1.5 x 1.4 cm. The endometrium measures 10 mm in thickness. There is mild fluid within the endometrium. The left ovary appears normal. There is a solid mass with mild inhomogeneous echotexture in the right adnexal region measuring 2.5 x 2.3 x 2.8 cm. No free pelvic fluid. IMPRESSION: No intrauterine gestation is seen. There is a mild amount of fluid in the endometrium. There are small intrauterine leiomyomas. There is a mildly complex solid structure in the right adnexal region. No gestational sac or fetus is seen within this structure. Differential considerations given these findings and the positive beta HCG suggesting approximately [redacted] week gestation include possible ectopic gestation and recent spontaneous abortion. Less likely possibility is intrauterine gestation too early to be seen by either transabdominal or transvaginal technique. The quantitative beta HCG value makes this possibility less likely. Close clinical and laboratory surveillance are warranted in this circumstance. Repeat timing of ultrasound will in part depend on beta HCG findings. Gynecologic evaluation is certainly advised given the lesion in the right ovary and the  beta HCG value in the absence of a documented intrauterine gestation. Electronically Signed   By: Bretta BangWilliam  Woodruff III M.D.   On: 12/07/2014 15:44   [place] Ob Transvaginal  12/07/2014  CLINICAL DATA:  Vaginal bleeding with positive beta HCG EXAM: OBSTETRIC <14 WK US AND TRANSVAGINAL OB US TECHNIQUE: Both transabdominal and transvaginal ultrasound examinations were performed for complete evaluation of the gestation as well as the maternal uterus, adnexal regions, and pelvic cul-de-sac. Transvaginal technique was performed to assess early pregnancy. COMPARISON:  None. FINDINGS: Intrauterine gestational sac: Not visualized Yolk sac:  Not visualized Embryo:  Not visualized Cardiac Activity: Not visualized Maternal uterus/adnexae: Uterus measures 9.3 x 6.4 x 7.2 cm. There is no intrauterine gestation. Within the uterus, there is a 3.0 x 2.3 x 2.5 cm leiomyoma. A small leiomyoma is also present measuring 1.7 x 1.5 x 1.4 cm. The endometrium measures 10 mm in thickness. There is mild fluid within the endometrium. The left ovary appears normal. There is a solid mass with mild inhomogeneous echotexture in the right adnexal region measuring 2.5 x 2.3 x 2.8 cm. No free pelvic fluid. IMPRESSION: No intrauterine gestation is seen. There is a mild amount of fluid in the endometrium. There are small intrauterine leiomyomas. There is a mildly complex solid structure in the right adnexal region. No gestational sac or fetus is seen within this structure. Differential considerations given these findings and the positive beta HCG suggesting approximately [redacted] week gestation include possible ectopic gestation and recent spontaneous abortion. Less likely possibility is intrauterine gestation too early to be seen by either transabdominal or transvaginal technique. The quantitative beta HCG value makes this possibility less likely. Close clinical and laboratory surveillance are warranted in this circumstance. Repeat timing of ultrasound will  in part depend on beta HCG findings. Gynecologic evaluation is certainly advised given the lesion in the right ovary and the beta HCG value in the absence of a documented intrauterine gestation. Electronically Signed   By: Bretta BangWilliam  Woodruff III M.D.   On: 12/07/2014 15:44   I have personally reviewed and evaluated these images and lab results as part of my medical decision-making.  [facility] with [facility] and she recommends patient follow up there in 48 hours for Bhcg.  Will send tissue(possible POC)  patient brought to pathology lab.   Cultures for GC, Chlamydia pending MDM  29 y.o. female with bleeding in pregnancy stable for d/c without hemorrhage or  acute abdomen. Strict precautions for no sex, no tampons, nothing in the vagina until follow up at [facility] in 2 days. Discussed with the patient and all questioned fully answered.    Final diagnoses:  Vaginal bleeding in pregnancy, first trimester    I personally performed the services described in this documentation, which was scribed in my presence. The recorded information has been reviewed and is accurate.      [place], [place] 12/07/14 2039  Benjiman Core, MD 12/10/14 1435

## 2014-12-08 LAB — GC/CHLAMYDIA PROBE AMP (~~LOC~~) NOT AT ARMC
Chlamydia: POSITIVE — AB
NEISSERIA GONORRHEA: NEGATIVE

## 2014-12-09 ENCOUNTER — Telehealth (HOSPITAL_COMMUNITY): Payer: Self-pay

## 2014-12-09 NOTE — Telephone Encounter (Signed)
Positive for chlamydia. Chart sent to EDP office for review.  

## 2014-12-10 ENCOUNTER — Telehealth (HOSPITAL_BASED_OUTPATIENT_CLINIC_OR_DEPARTMENT_OTHER): Payer: Self-pay | Admitting: Emergency Medicine

## 2014-12-10 NOTE — Telephone Encounter (Signed)
Post ED Visit - Positive Culture Follow-up: Successful Patient Follow-Up  Culture assessed and recommendations reviewed by: []  Sherry Hayes, Pharm.D., BCPS-AQ ID []  Sherry Hayes, Pharm.D., BCPS []  [place] Hayes, [address]Pharm[address]D., BCPS, AAHIVP []  Sherry Hayes, Pharm.D., BCPS, AAHIVP []  Sherry Hayes, [address] []  Sherry Hayes, Pharm.D.  Positive  chlamydia culture  [x]  Patient discharged without antimicrobial prescription and treatment is now indicated []  Organism is resistant to prescribed ED discharge antimicrobial []  Patient with positive blood cultures  Changes discussed with ED provider: Sharilyn SitesLisa Sanders PA New antibiotic prescription Azithromycin 1000mg  po x [facility]  Contacted patient, 12/10/14 1410   [name], Sherry Popoca 12/10/2014, 2:08 PM

## 2014-12-19 ENCOUNTER — Inpatient Hospital Stay (HOSPITAL_COMMUNITY)
Admission: AD | Admit: 2014-12-19 | Discharge: 2014-12-19 | Disposition: A | Discharge status: Home / Self Care | Payer: Medicaid Other | Source: Ambulatory Visit | Attending: Family Medicine | Admitting: Family Medicine

## 2014-12-19 DIAGNOSIS — O039 Complete or unspecified spontaneous abortion without complication: Secondary | ICD-10-CM | POA: Diagnosis not present

## 2014-12-19 DIAGNOSIS — F17200 Nicotine dependence, unspecified, uncomplicated: Secondary | ICD-10-CM | POA: Insufficient documentation

## 2014-12-19 DIAGNOSIS — Z113 Encounter for screening for infections with a predominantly sexual mode of transmission: Secondary | ICD-10-CM | POA: Insufficient documentation

## 2014-12-19 LAB — HCG, QUANTITATIVE, PREGNANCY: HCG, BETA CHAIN, QUANT, S: 3501 m[IU]/mL — AB (ref <5)

## 2014-12-19 NOTE — Discharge Instructions (Signed)
Miscarriage  A miscarriage is the sudden loss of an unborn baby (fetus) before the 20th week of pregnancy. Most miscarriages happen in the first 3 months of pregnancy. Sometimes, it happens before a woman even knows she is pregnant. A miscarriage is also called a "spontaneous miscarriage" or "early pregnancy loss." Having a miscarriage can be an emotional experience. Talk with your caregiver about any questions you may have about miscarrying, the grieving process, and your future pregnancy plans.  CAUSES    Problems with the fetal chromosomes that make it impossible for the baby to develop normally. Problems with the baby's genes or chromosomes are most often the result of errors that occur, by chance, as the embryo divides and grows. The problems are not inherited from the parents.   Infection of the cervix or uterus.    Hormone problems.    Problems with the cervix, such as having an incompetent cervix. This is when the tissue in the cervix is not strong enough to hold the pregnancy.    Problems with the uterus, such as an abnormally shaped uterus, uterine fibroids, or congenital abnormalities.    Certain medical conditions.    Smoking, drinking alcohol, or taking illegal drugs.    Trauma.   Often, the cause of a miscarriage is unknown.   SYMPTOMS    Vaginal bleeding or spotting, with or without cramps or pain.   Pain or cramping in the abdomen or lower back.   Passing fluid, tissue, or blood clots from the vagina.  DIAGNOSIS   Your caregiver will perform a physical exam. You may also have an ultrasound to confirm the miscarriage. Blood or urine tests may also be ordered.  TREATMENT    Sometimes, treatment is not necessary if you naturally pass all the fetal tissue that was in the uterus. If some of the fetus or placenta remains in the body (incomplete miscarriage), tissue left behind may become infected and must be removed. Usually, a dilation and curettage (D and C) procedure is performed.  During a D and C procedure, the cervix is widened (dilated) and any remaining fetal or placental tissue is gently removed from the uterus.   Antibiotic medicines are prescribed if there is an infection. Other medicines may be given to reduce the size of the uterus (contract) if there is a lot of bleeding.   If you have Rh negative blood and your baby was Rh positive, you will need a Rh immunoglobulin shot. This shot will protect any future baby from having Rh blood problems in future pregnancies.  HOME CARE INSTRUCTIONS    Your caregiver may order bed rest or may allow you to continue light activity. Resume activity as directed by your caregiver.   Have someone help with home and family responsibilities during this time.    Keep track of the number of sanitary pads you use each day and how soaked (saturated) they are. Write down this information.    Do not use tampons. Do not douche or have sexual intercourse until approved by your caregiver.    Only take over-the-counter or prescription medicines for pain or discomfort as directed by your caregiver.    Do not take aspirin. Aspirin can cause bleeding.    Keep all follow-up appointments with your caregiver.    If you or your partner have problems with grieving, talk to your caregiver or seek counseling to help cope with the pregnancy loss. Allow enough time to grieve before trying to get pregnant again.     SEEK IMMEDIATE MEDICAL CARE IF:    You have severe cramps or pain in your back or abdomen.   You have a fever.   You pass large blood clots (walnut-sized or larger) ortissue from your vagina. Save any tissue for your caregiver to inspect.    Your bleeding increases.    You have a thick, bad-smelling vaginal discharge.   You become lightheaded, weak, or you faint.    You have chills.   MAKE SURE YOU:   Understand these instructions.   Will watch your condition.   Will get help right away if you are not doing well or get worse.     This  information is not intended to replace advice given to you by your health care provider. Make sure you discuss any questions you have with your health care provider.     Document Released: 08/07/2000 Document Revised: 06/08/2012 Document Reviewed: 04/02/2011  Elsevier Interactive Patient Education 2016 [employer]

## 2014-12-19 NOTE — MAU Note (Signed)
Patient called at from lobby with no response.

## 2014-12-19 NOTE — MAU Provider Note (Signed)
History   [id number]   Chief Complaint  Patient presents with  . Throat Check   . STD Testing     HPI Sherry Hayes is a 29 y.o. female G1P0101 here for follow-up BHCG.  Upon review of the records [facility]. Pt thought she passed tissue at home.   BHCG on that day was [id number].  Ultrasound showed see results below.  GC/CT and wet prep were collected.  Results were positive for chlamydia - pt treated 10/15.   Pt discharged home after APP c/w Dr. Jolayne Pantheronstant - pt to follow up in 48 hrs for BHCG.   Pt here today with no report of abdominal pain or vaginal bleeding. Reports continued vaginal discharge.   All other systems negative.   Ultrasound on 10/12: IMPRESSION: No intrauterine gestation is seen. There is a mild amount of fluid in the endometrium. There are small intrauterine leiomyomas.  There is a mildly complex solid structure in the right adnexal region. No gestational sac or fetus is seen within this structure.  Differential considerations given these findings and the positive beta HCG suggesting approximately [redacted] week gestation include possible ectopic gestation and recent spontaneous abortion. Less likely possibility is intrauterine gestation too early to be seen by either transabdominal or transvaginal technique. The quantitative beta HCG value makes this possibility less likely.  Close clinical and laboratory surveillance are warranted in this circumstance. Repeat timing of ultrasound will in part depend on beta HCG findings. Gynecologic evaluation is certainly advised given the lesion in the right ovary and the beta HCG value in the absence of a documented intrauterine gestation.   Electronically Signed  By: Bretta BangWilliam Woodruff III M.D.  On: 12/07/2014 15:44   Patient's last menstrual period was 11/28/2014.  OB History  Gravida Para Term Preterm AB SAB TAB Ectopic Multiple Living  1 1 0 1 0 0    1    # Outcome  Date GA Lbr Len/2nd Weight Sex Delivery Anes PTL Lv  1 Preterm 03/09/04 [id number]    CS-Unspec         Past Medical History  Diagnosis Date  . Gonorrhea   . Chlamydia   . Anemia   . Pelvic pain   . Hydrosalpinx   . Abnormal Pap smear     ASCUS +HPV  . 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 Dysrhythmia     stress induced- none x 1 year/ "skipping"  . GERD (gastroesophageal reflux disease)     diet related    No family history on file.  Social History   Social History  . Marital Status: Single    Spouse Name: N/A  . Number of Children: N/A  . Years of Education: N/A   Social History Main Topics  . Smoking status: Current Every Day Smoker -- 0.25 packs/day    Types: Cigarettes  . Smokeless tobacco: Never Used  . Alcohol Use: Yes     Comment: every other weekend  . Drug Use: No  . Sexual Activity: Yes    Birth Control/ Protection: None   Other Topics Concern  . Not on file   Social History Narrative    No Known Allergies  No current facility-administered medications on file prior to encounter.   Current Outpatient Prescriptions on File Prior to Encounter  Medication Sig Dispense Refill  . famotidine (PEPCID) 20 MG tablet Take 1 tablet (20 mg total) by  mouth 2 (two) times daily. 30 tablet 0  . omeprazole (PRILOSEC) 20 MG capsule Take 1 capsule (20 mg total) by mouth daily. 30 capsule 0     Physical Exam   Filed Vitals:   12/19/14 1400  BP: 131/62  Pulse: 99  Temp: 98.4 F (36.9 C)  TempSrc: Oral  Resp: 16  Height: 5' 2.5" (1.588 m)  Weight: 173 lb (78.472 kg)    Physical Exam  Constitutional: She appears well-developed and well-nourished. No distress.  HENT:  Head: Normocephalic and atraumatic.  Cardiovascular: Normal rate.   Respiratory: Effort normal. No respiratory distress.  Skin: She is not diaphoretic.  Psychiatric: She has a normal mood and affect. Her behavior is normal. Judgment and thought content normal.     MAU Course  Procedures Results for orders placed or performed during the hospital encounter of 12/19/14 (from the past 24 hour(s))  hCG, quantitative, pregnancy     Status: Abnormal   Collection Time: 12/19/14  2:43 PM  Result Value Ref Range   hCG, Beta Chain, Quant, S 3501 (H) <5 mIU/mL    MDM BHCG has decreased - pt states she didn't come for f/u BHCG in 48 hrs d/t work Pt denies abdominal pain, vaginal bleeding, or fever Informed pt she should wait >3 weeks for test of cure for chlamydia Discussed pt with Dr. Adrian Blackwater - discussed BHCG & ultrasound from 10/12. Discharge home to f/u in clinic in 2 weeks.   Assessment and Plan  29 y.o. G1P0101 at Unknown wks Pregnancy Follow-up BHCG 1. Miscarriage    P: Discharge home Schedule appointment in [facility] in 2 weeks for follow up Discussed reasons to return to MAU.   Judeth Horn, NP 12/19/2014 2:18 PM

## 2014-12-19 NOTE — MAU Note (Signed)
Patient presents stating that she had a miscarriage 2 weeks ago and was diagnosed with Chlamydia at that time and treated. Would like to make sure that the STD is gone and would also like to have her throat checked. States she is a smoker and the top of her throat at the back of her mouth looks yellowish.

## 2015-01-12 ENCOUNTER — Inpatient Hospital Stay (HOSPITAL_COMMUNITY): Payer: Medicaid Other

## 2015-01-12 ENCOUNTER — Encounter (HOSPITAL_COMMUNITY): Payer: Self-pay | Admitting: *Deleted

## 2015-01-12 ENCOUNTER — Inpatient Hospital Stay (HOSPITAL_COMMUNITY)
Admission: AD | Admit: 2015-01-12 | Discharge: 2015-01-12 | Disposition: A | Discharge status: Home / Self Care | Payer: Medicaid Other | Source: Ambulatory Visit | Attending: Obstetrics and Gynecology | Admitting: Obstetrics and Gynecology

## 2015-01-12 DIAGNOSIS — Z3A01 Less than 8 weeks gestation of pregnancy: Secondary | ICD-10-CM | POA: Diagnosis not present

## 2015-01-12 DIAGNOSIS — O209 Hemorrhage in early pregnancy, unspecified: Secondary | ICD-10-CM | POA: Insufficient documentation

## 2015-01-12 DIAGNOSIS — F17210 Nicotine dependence, cigarettes, uncomplicated: Secondary | ICD-10-CM | POA: Diagnosis not present

## 2015-01-12 DIAGNOSIS — O26851 Spotting complicating pregnancy, first trimester: Secondary | ICD-10-CM

## 2015-01-12 DIAGNOSIS — O3680X0 Pregnancy with inconclusive fetal viability, not applicable or unspecified: Secondary | ICD-10-CM

## 2015-01-12 HISTORY — DX: Unspecified abnormal cytological findings in specimens from vagina: R87.629

## 2015-01-12 LAB — URINE MICROSCOPIC-ADD ON
SQUAMOUS EPITHELIAL / LPF: NONE SEEN
WBC UA: NONE SEEN WBC/hpf (ref 0–5)

## 2015-01-12 LAB — URINALYSIS, ROUTINE W REFLEX MICROSCOPIC
BILIRUBIN URINE: NEGATIVE
Glucose, UA: NEGATIVE mg/dL
KETONES UR: NEGATIVE mg/dL
LEUKOCYTES UA: NEGATIVE
NITRITE: NEGATIVE
Protein, ur: NEGATIVE mg/dL
Specific Gravity, Urine: 1.015 (ref 1.005–1.030)
pH: 6 (ref 5.0–8.0)

## 2015-01-12 LAB — CBC
HCT: 32.8 % — ABNORMAL LOW (ref 36.0–46.0)
HEMOGLOBIN: 10.5 g/dL — AB (ref 12.0–15.0)
MCH: 25.7 pg — AB (ref 26.0–34.0)
MCHC: 32 g/dL (ref 30.0–36.0)
MCV: 80.4 fL (ref 78.0–100.0)
PLATELETS: 246 10*3/uL (ref 150–400)
RBC: 4.08 MIL/uL (ref 3.87–5.11)
RDW: 16 % — ABNORMAL HIGH (ref 11.5–15.5)
WBC: 5.9 10*3/uL (ref 4.0–10.5)

## 2015-01-12 LAB — WET PREP, GENITAL
CLUE CELLS WET PREP: NONE SEEN
Sperm: NONE SEEN
TRICH WET PREP: NONE SEEN
WBC WET PREP: NONE SEEN
YEAST WET PREP: NONE SEEN

## 2015-01-12 LAB — POCT PREGNANCY, URINE: PREG TEST UR: POSITIVE — AB

## 2015-01-12 LAB — HCG, QUANTITATIVE, PREGNANCY: HCG, BETA CHAIN, QUANT, S: 157 m[IU]/mL — AB (ref <5)

## 2015-01-12 NOTE — MAU Note (Signed)
Started spotting today at work. Has had positive pregnancy tests at home. Some cramping

## 2015-01-12 NOTE — MAU Provider Note (Signed)
History     CSN: [id number]  Arrival date and time: 01/12/15 6295   First Provider Initiated Contact with Patient 01/12/15 2104      Chief Complaint  Patient presents with  . Vaginal Bleeding   HPI Comments: Sherry Hayes is a 29 y.o. [id number] at [redacted]w[redacted]d who presents today with vaginal bleeding. She had a SAB in September. She had is unsure if she had a period after the miscarriage. She states that she stopped bleeding around 02/26/14.   Vaginal Bleeding The patient's primary symptoms include vaginal bleeding. This is a new problem. The current episode started today. The problem occurs constantly. The problem has been unchanged. The patient is experiencing no pain. She is pregnant. The vaginal discharge was bloody. The vaginal bleeding is spotting. She has not been passing clots. She has not been passing tissue. Nothing aggravates the symptoms. She has tried nothing for the symptoms. She is sexually active. She uses nothing for contraception.    Past Medical History  Diagnosis Date  . Gonorrhea   . Chlamydia   . Anemia   . Pelvic pain   . Hydrosalpinx   . Abnormal Pap smear     ASCUS +HPV  . 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 Dysrhythmia     stress induced- none x 1 year/ "skipping"  . GERD (gastroesophageal reflux disease)     diet related  . Vaginal Pap smear, abnormal     Past Surgical History  Procedure Laterality Date  . Cesarean section    . Mass excision  03/12/2012    Procedure: EXCISION MASS;  Surgeon: Shelly Rubenstein, MD;  Location: [facility];  Service: General;  Laterality: N/A;  Excision of Abdominal Wall Mass    History reviewed. No pertinent family history.  Social History  Substance Use Topics  . Smoking status: Current Every Day Smoker -- 0.25 packs/day    Types: Cigarettes  . Smokeless tobacco: Never Used  . Alcohol Use: Yes     Comment: every other weekend    Allergies: No Known Allergies  No prescriptions prior to  admission    Review of Systems  Genitourinary: Positive for vaginal bleeding.   Physical Exam   Blood pressure 135/69, pulse 85, temperature 98.5 F (36.9 C), temperature source Oral, resp. rate 18, height  (1.6 m), weight 77.111 kg (170 lb), last menstrual period 12/20/2014.  Physical Exam  Nursing note and vitals reviewed. Constitutional: She is oriented to person, place, and time. She appears well-developed and well-nourished. No distress.  HENT:  Head: Normocephalic.  Eyes: Pupils are equal, round, and reactive to light.  Cardiovascular: Normal rate.   Respiratory: Effort normal.  GI: Soft. There is no tenderness. There is no rebound.  Neurological: She is alert and oriented to person, place, and time.  Skin: Skin is warm and dry.  Psychiatric: She has a normal mood and affect.   Results for orders placed or performed during the hospital encounter of 01/12/15 (from the past 24 hour(s))  Urinalysis, Routine w reflex microscopic (not at [facility])     Status: Abnormal   Collection Time: 01/12/15  7:35 PM  Result Value Ref Range   Color, Urine YELLOW YELLOW   APPearance CLEAR CLEAR   Specific Gravity, Urine 1.015 1.005 - 1.030   pH 6.0 5.0 - 8.0   Glucose, UA NEGATIVE NEGATIVE mg/dL   Hgb urine dipstick MODERATE (A) NEGATIVE   Bilirubin Urine NEGATIVE NEGATIVE  Ketones, ur NEGATIVE NEGATIVE mg/dL   Protein, ur NEGATIVE NEGATIVE mg/dL   Nitrite NEGATIVE NEGATIVE   Leukocytes, UA NEGATIVE NEGATIVE  Urine microscopic-add on     Status: Abnormal   Collection Time: 01/12/15  7:35 PM  Result Value Ref Range   Squamous Epithelial / LPF NONE SEEN NONE SEEN   WBC, UA NONE SEEN 0 - 5 WBC/hpf   RBC / HPF 0-5 0 - 5 RBC/hpf   Bacteria, UA RARE (A) NONE SEEN  Pregnancy, urine POC     Status: Abnormal   Collection Time: 01/12/15  8:29 PM  Result Value Ref Range   Preg Test, Ur POSITIVE (A) NEGATIVE  CBC     Status: Abnormal   Collection Time: 01/12/15  9:11 PM  Result Value Ref  Range   WBC 5.9 4.0 - 10.5 K/uL   RBC 4.08 3.87 - 5.11 MIL/uL   Hemoglobin 10.5 (L) 12.0 - 15.0 g/dL   HCT 16.1 (L) 09.6 - 04.5 %   MCV 80.4 78.0 - 100.0 fL   MCH 25.7 (L) 26.0 - 34.0 pg   MCHC 32.0 30.0 - 36.0 g/dL   RDW 40.9 (H) 81.1 - 91.4 %   Platelets 246 150 - 400 K/uL  hCG, quantitative, pregnancy     Status: Abnormal   Collection Time: 01/12/15  9:11 PM  Result Value Ref Range   hCG, Beta Chain, Quant, S 157 (H) <5 mIU/mL  Wet prep, genital     Status: None   Collection Time: 01/12/15  9:20 PM  Result Value Ref Range   Yeast Wet Prep HPF POC NONE SEEN NONE SEEN   Trich, Wet Prep NONE SEEN NONE SEEN   Clue Cells Wet Prep HPF POC NONE SEEN NONE SEEN   WBC, Wet Prep HPF POC NONE SEEN NONE SEEN   Sperm NONE SEEN    US Ob Comp Less 14 Wks  01/12/2015  CLINICAL DATA:  Acute onset of vaginal bleeding.  Initial encounter. EXAM: OBSTETRIC <14 WK [place] AND TRANSVAGINAL OB US TECHNIQUE: Both transabdominal and transvaginal ultrasound examinations were performed for complete evaluation of the gestation as well as the maternal uterus, adnexal regions, and pelvic cul-de-sac. Transvaginal technique was performed to assess early pregnancy. COMPARISON:  Pelvic ultrasound performed 12/07/2014 FINDINGS: Intrauterine gestational sac: None seen. Yolk sac:  N/A Embryo:  N/A Maternal uterus/adnexae: A mildly hyperechoic focus is noted at the right adnexa medial to the right ovary, with an anechoic center, raising question for ectopic pregnancy. This measures 3.2 x 3.1 x 3.0 cm. The ovaries are otherwise unremarkable. The right ovary measures 4.0 x 2.8 x 2.3 cm, while the left ovary measures 3.1 x 1.6 x 2.0 cm. The uterus is unremarkable in appearance. A small amount of fluid is noted at the cervical canal. No free fluid is seen within the pelvic cul-de-sac. IMPRESSION: 1. Suspect ectopic pregnancy at the right adnexa, measuring 3.2 x 3.1 x 3.0 cm. Would correlate with the patient's symptoms. This would be  relatively definitive for ectopic pregnancy if the quantitative beta HCG level is above 1,000. If the quantitative beta HCG is still below 1,000, would consider follow-up pelvic ultrasound in 1 week. 2. No intrauterine pregnancy seen. 3. Small amount of fluid noted at the cervical canal. These results were called by telephone at the time of interpretation on 01/12/2015 at 10:19 pm to [facility] CNM, who verbally acknowledged these results. Electronically Signed   By: Roanna Raider M.D.   On: 01/12/2015 22:23  [place] Ob Transvaginal  01/12/2015  CLINICAL DATA:  Acute onset of vaginal bleeding.  Initial encounter. EXAM: OBSTETRIC <14 WK US AND TRANSVAGINAL OB US TECHNIQUE: Both transabdominal and transvaginal ultrasound examinations were performed for complete evaluation of the gestation as well as the maternal uterus, adnexal regions, and pelvic cul-de-sac. Transvaginal technique was performed to assess early pregnancy. COMPARISON:  Pelvic ultrasound performed 12/07/2014 FINDINGS: Intrauterine gestational sac: None seen. Yolk sac:  N/A Embryo:  N/A Maternal uterus/adnexae: A mildly hyperechoic focus is noted at the right adnexa medial to the right ovary, with an anechoic center, raising question for ectopic pregnancy. This measures 3.2 x 3.1 x 3.0 cm. The ovaries are otherwise unremarkable. The right ovary measures 4.0 x 2.8 x 2.3 cm, while the left ovary measures 3.1 x 1.6 x 2.0 cm. The uterus is unremarkable in appearance. A small amount of fluid is noted at the cervical canal. No free fluid is seen within the pelvic cul-de-sac. IMPRESSION: 1. Suspect ectopic pregnancy at the right adnexa, measuring 3.2 x 3.1 x 3.0 cm. Would correlate with the patient's symptoms. This would be relatively definitive for ectopic pregnancy if the quantitative beta HCG level is above 1,000. If the quantitative beta HCG is still below 1,000, would consider follow-up pelvic ultrasound in 1 week. 2. No intrauterine pregnancy seen. 3.  Small amount of fluid noted at the cervical canal. These results were called by telephone at the time of interpretation on 01/12/2015 at 10:19 pm to [facility] [place] CNM, who verbally acknowledged these results. Electronically Signed   By: Roanna RaiderJeffery  Chang M.D.   On: 01/12/2015 22:23    MAU Course  Procedures  MDM 2239: D/W Dr. Adrian BlackwaterStinson will repeat HCG in 48 hours.   Assessment and Plan   1. Pregnancy, location unknown   2. Vaginal bleeding in pregnancy, first trimester    Uncertain if HCG is still elevated from SAB in late October or if this is a new pregnancy No evidence of retained POC, but questionable ectopic pregnancy on US DC home Strict ectopic precautions given Return to MAU in 48 hours for repeat HCG If continuing to rise or any concern for ectopic pregnancy consider repeat US in 1 week (HCG would be closer to 1500) Patient has no abdominal pain today   Tawnya CrookHogan, Verlee Pope Donovan 01/12/2015, 9:08 PM

## 2015-01-12 NOTE — Discharge Instructions (Signed)

## 2015-01-13 LAB — GC/CHLAMYDIA PROBE AMP (~~LOC~~) NOT AT ARMC
Chlamydia: NEGATIVE
Neisseria Gonorrhea: NEGATIVE

## 2015-01-13 LAB — RPR: RPR Ser Ql: NONREACTIVE

## 2015-01-13 LAB — HIV ANTIBODY (ROUTINE TESTING W REFLEX): HIV Screen 4th Generation wRfx: NONREACTIVE

## 2015-11-17 ENCOUNTER — Encounter (HOSPITAL_COMMUNITY): Payer: Self-pay

## 2016-03-05 ENCOUNTER — Encounter (HOSPITAL_COMMUNITY): Payer: Self-pay | Admitting: Emergency Medicine

## 2016-03-05 ENCOUNTER — Emergency Department (HOSPITAL_COMMUNITY)
Admission: EM | Admit: 2016-03-05 | Discharge: 2016-03-05 | Disposition: A | Discharge status: Home / Self Care | Payer: Medicaid Other | Attending: Emergency Medicine | Admitting: Emergency Medicine

## 2016-03-05 DIAGNOSIS — J111 Influenza due to unidentified influenza virus with other respiratory manifestations: Secondary | ICD-10-CM | POA: Insufficient documentation

## 2016-03-05 DIAGNOSIS — F17210 Nicotine dependence, cigarettes, uncomplicated: Secondary | ICD-10-CM | POA: Diagnosis not present

## 2016-03-05 DIAGNOSIS — R6889 Other general symptoms and signs: Secondary | ICD-10-CM

## 2016-03-05 DIAGNOSIS — R69 Illness, unspecified: Secondary | ICD-10-CM

## 2016-03-05 DIAGNOSIS — R05 Cough: Secondary | ICD-10-CM | POA: Diagnosis present

## 2016-03-05 MED ORDER — OSELTAMIVIR PHOSPHATE 75 MG PO CAPS: 75.0000 mg | ORAL_CAPSULE | Freq: Two times a day (BID) | ORAL | 0 refills | Status: DC

## 2016-03-05 NOTE — ED Provider Notes (Signed)
[facility]-EMERGENCY DEPT Provider Note   CSN: [id number] Arrival date & time: 03/05/16  1613  By signing my name below, I, Sonum Patel, attest that this documentation has been prepared under the direction and in the presence of Ponciano OrtLeslie Karen Rock RidgeSofia, [place]PA-C . Electronically Signed: Sonum Patel, Neurosurgeoncribe. 03/05/16. 6:48 PM. History   Chief Complaint Chief Complaint  Patient presents with  . URI   The history is provided by the patient. No language interpreter was used.     HPI Comments: Sherry Hayes is a 31 y.o. female who presents to the Emergency Department complaining of a gradually improving cough productive of sputum that began last night. She reports associated chest congestion. She reports sick contacts at work. She denies fever, chills. She denies receiving the flu vaccines this season.   Past Medical History:  Diagnosis Date  . Abnormal Pap smear    ASCUS +HPV  . Anemia   . 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 Chlamydia   . Dysrhythmia    stress induced- none x 1 year/ "skipping"  . GERD (gastroesophageal reflux disease)    diet related  . Gonorrhea   . Hydrosalpinx   . Pelvic pain   . Vaginal Pap smear, abnormal     Patient Active Problem List   Diagnosis Date Noted  . Endometrioma 02/07/2012  . 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 CESAREAN SECTION    . MASS EXCISION  03/12/2012   Procedure: EXCISION MASS;  Surgeon: Shelly Rubensteinouglas A Blackman, MD;  Location: [facility];  Service: General;  Laterality: N/A;  Excision of Abdominal Wall Mass    OB History    Gravida Para Term Preterm AB Living   3 1 0 1 1 1    SAB TAB Ectopic Multiple Live Births   1               Home Medications    Prior to Admission medications   Not on File    Family History No family history on file.  Social History Social History  Substance Use Topics  . Smoking status: Current Every Day Smoker   Packs/day: 0.25    Types: Cigarettes  . Smokeless tobacco: Never Used  . Alcohol use Yes     Comment: every other weekend     Allergies   Patient has no known allergies.   Review of Systems Review of Systems  Constitutional: Negative for chills and fever.  Respiratory: Positive for cough.   All other systems reviewed and are negative.    Physical Exam Updated Vital Signs BP 110/73   Pulse 94   Temp 99.8 F (37.7 C) (Oral)   Resp 18   LMP 02/24/2015   SpO2 99%   Physical Exam  Constitutional: She is oriented to person, place, and time. She appears well-developed and well-nourished.  HENT:  Head: Normocephalic and atraumatic.  Mouth/Throat: Oropharynx is clear and moist. No oropharyngeal exudate.  Eyes: EOM are normal. Pupils are equal, round, and reactive to light.  Neck: Normal range of motion. Neck supple.  Cardiovascular: Normal rate, regular rhythm and normal heart sounds.   Pulmonary/Chest: Effort normal and breath sounds normal. No respiratory distress. She has no wheezes. She has no rales.  Neurological: She is alert and oriented to person, place, and time.  Skin: Skin is warm and dry.  Psychiatric: She has a normal mood and affect.  Nursing  note and vitals reviewed.    ED Treatments / Results  DIAGNOSTIC STUDIES: Oxygen Saturation is 99% on RA, normal by my interpretation.    COORDINATION OF CARE: 6:48 PM Discussed treatment plan with pt at bedside and pt agreed to plan.    Labs (all labs ordered are listed, but only abnormal results are displayed) Labs Reviewed - No data to display  EKG  EKG Interpretation None       Radiology No results found.  Procedures Procedures (including critical care time)  Medications Ordered in ED Medications - No data to display   Initial Impression / Assessment and Plan / ED Course  I have reviewed the triage vital signs and the nursing notes.  Pertinent labs & imaging results that were available during  my care of the patient were reviewed by me and considered in my medical decision making (see chart for details).  Clinical Course     Pt symptoms consistent with URI. Pt will be discharged with symptomatic treatment.  Discussed return precautions.  Pt is hemodynamically stable & in NAD prior to discharge.   Final Clinical Impressions(s) / ED Diagnoses   Final diagnoses:  Flu-like symptoms  Influenza-like illness    New Prescriptions New Prescriptions   OSELTAMIVIR (TAMIFLU) 75 MG CAPSULE    Take 1 capsule (75 mg total) by mouth every 12 (twelve) hours.  An After Visit Summary was printed and given to the patient.   Lonia Skinner [place], PA-C 03/05/16 1852    Benjiman Core, MD 03/05/16 2351

## 2016-03-05 NOTE — ED Triage Notes (Signed)
Pt reports URI x a day, productive cough and chest congestion . Denies fever.

## 2016-03-05 NOTE — ED Notes (Signed)
Pt ambulatory and independent at discharge.  Verbalized understanding of discharge instructions 

## 2016-05-05 ENCOUNTER — Emergency Department (HOSPITAL_COMMUNITY)
Admission: EM | Admit: 2016-05-05 | Discharge: 2016-05-05 | Disposition: A | Discharge status: Home / Self Care | Payer: Medicaid Other | Attending: Emergency Medicine | Admitting: Emergency Medicine

## 2016-05-05 ENCOUNTER — Encounter (HOSPITAL_COMMUNITY): Payer: Self-pay | Admitting: *Deleted

## 2016-05-05 DIAGNOSIS — F17210 Nicotine dependence, cigarettes, uncomplicated: Secondary | ICD-10-CM | POA: Diagnosis not present

## 2016-05-05 DIAGNOSIS — H00014 Hordeolum externum left upper eyelid: Secondary | ICD-10-CM

## 2016-05-05 MED ORDER — ERYTHROMYCIN 5 MG/GM OP OINT: TOPICAL_OINTMENT | OPHTHALMIC | 0 refills | Status: DC

## 2016-05-05 NOTE — ED Notes (Signed)
See providers assessment.  

## 2016-05-05 NOTE — ED Provider Notes (Signed)
[facility]-EMERGENCY DEPT Provider Note   CSN: [id number] Arrival date & time: 05/05/16  1429  By signing my name below, I, Sherry Hayes, attest that this documentation has been prepared under the direction and in the presence of physician practitioner, Arby Barrette, MD. Electronically Signed: Linna Hayes, Scribe. 05/05/2016. 4:07 PM.  History   Chief Complaint Chief Complaint  Patient presents with  . Stye    The history is provided by the patient. No language interpreter was used.     HPI Comments: Sherry Hayes is a 31 y.o. female who presents to the Emergency Department complaining of constant swelling to her left upper eyelid beginning yesterday. She notes associated eyelid irritation and frequent watering from her left eye. Pt states she can feel a "lump" on her left upper eyelid. She has applied warm compresses with some improvement of the swelling. She denies eye redness, fever, chills, nasal congestion, sore throat, or any other associated symptoms.  Past Medical History:  Diagnosis Date  . Abnormal Pap smear    ASCUS +HPV  . Anemia   . 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 Chlamydia   . Dysrhythmia    stress induced- none x 1 year/ "skipping"  . GERD (gastroesophageal reflux disease)    diet related  . Gonorrhea   . Hydrosalpinx   . Pelvic pain   . Vaginal Pap smear, abnormal     Patient Active Problem List   Diagnosis Date Noted  . Endometrioma 02/07/2012  . 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 CESAREAN SECTION    . MASS EXCISION  03/12/2012   Procedure: EXCISION MASS;  Surgeon: Shelly Rubenstein, MD;  Location: [facility];  Service: General;  Laterality: N/A;  Excision of Abdominal Wall Mass    OB History    Gravida Para Term Preterm AB Living   3 1 0 1 1 1    SAB TAB Ectopic Multiple Live Births   1               Home Medications    Prior to Admission  medications   Medication Sig Start Date End Date Taking? Authorizing Provider  erythromycin ophthalmic ointment Place a 1/2 inch ribbon of ointment into the lower eyelid. 05/05/16   Arby Barrette, MD  oseltamivir (TAMIFLU) 75 MG capsule Take 1 capsule (75 mg total) by mouth every 12 (twelve) hours. 03/05/16   Elson Areas, PA-C    Family History No family history on file.  Social History Social History  Substance Use Topics  . Smoking status: Current Every Day Smoker    Packs/day: 0.25    Types: Cigarettes  . Smokeless tobacco: Never Used  . Alcohol use Yes     Comment: every other weekend     Allergies   Patient has no known allergies.   Review of Systems Review of Systems  Constitutional: Negative for chills and fever.  HENT: Negative for congestion and sore throat.   Eyes: Positive for pain (irritation). Negative for redness.       +L eyelid swelling.    Physical Exam Updated Vital Signs BP 123/68 (BP Location: Left Arm)   Pulse 83   Temp 98.5 F (36.9 C) (Oral)   Resp 20   Ht 5' 3.5" (1.613 m)   Wt 182 lb (82.6 kg)   LMP 04/14/2016   SpO2 98%   BMI 31.73 kg/m  Physical Exam  Constitutional: She is oriented to person, place, and time. She appears well-developed and well-nourished. No distress.  HENT:  Head: Normocephalic and atraumatic.  Eyes: Conjunctivae and EOM are normal.  Patient has erythematous small nodular swelling in the left upper eyelid at the lash line. Less than 5 mm nodule. Minimal associated swelling. Mildly tender. Eye otherwise normal with no scleral injection, normal extraocular motions  Neck: Normal range of motion. Neck supple.  Cardiovascular: Normal rate, regular rhythm, normal heart sounds and intact distal pulses.  Exam reveals no gallop and no friction rub.   No murmur heard. Pulmonary/Chest: Effort normal and breath sounds normal. No respiratory distress. She has no wheezes. She has no rales. She exhibits no tenderness.    Musculoskeletal: Normal range of motion.  Neurological: She is alert and oriented to person, place, and time. No cranial nerve deficit. She exhibits normal muscle tone. Coordination normal.  Skin: Skin is warm and dry.  Psychiatric: She has a normal mood and affect.  Nursing note and vitals reviewed.   ED Treatments / Results  Labs (all labs ordered are listed, but only abnormal results are displayed) Labs Reviewed - No data to display  EKG  EKG Interpretation None       Radiology No results found.  Procedures Procedures (including critical care time)  DIAGNOSTIC STUDIES: Oxygen Saturation is 98% on RA, normal by my interpretation.    COORDINATION OF CARE: 4:11 PM Discussed treatment plan with pt at bedside and pt agreed to plan.  Medications Ordered in ED Medications - No data to display   Initial Impression / Assessment and Plan / ED Course  I have reviewed the triage vital signs and the nursing notes.  Pertinent labs & imaging results that were available during my care of the patient were reviewed by me and considered in my medical decision making (see chart for details).       Final Clinical Impressions(s) / ED Diagnoses   Final diagnoses:  Hordeolum externum of left upper eyelid  patient has small, uncomplicated hordeolum. She will be given erythromycin ointment and is counseled on warm compresses.  New Prescriptions New Prescriptions   ERYTHROMYCIN OPHTHALMIC OINTMENT    Place a 1/2 inch ribbon of ointment into the lower eyelid.      Arby BarretteMarcy Amaree Loisel, MD 05/05/16 1622

## 2016-05-05 NOTE — ED Triage Notes (Signed)
Pt states swelling to L upper eyelid that has improved since yesterday.

## 2016-10-19 IMAGING — CR DG CHEST 2V
2 series · 2 of 2 positions shown · non-contrast
Comparison: 05/18/2012

CLINICAL DATA: Intermittent chest pain for 1 month

EXAM:
CHEST  2 VIEW

[chest pa]
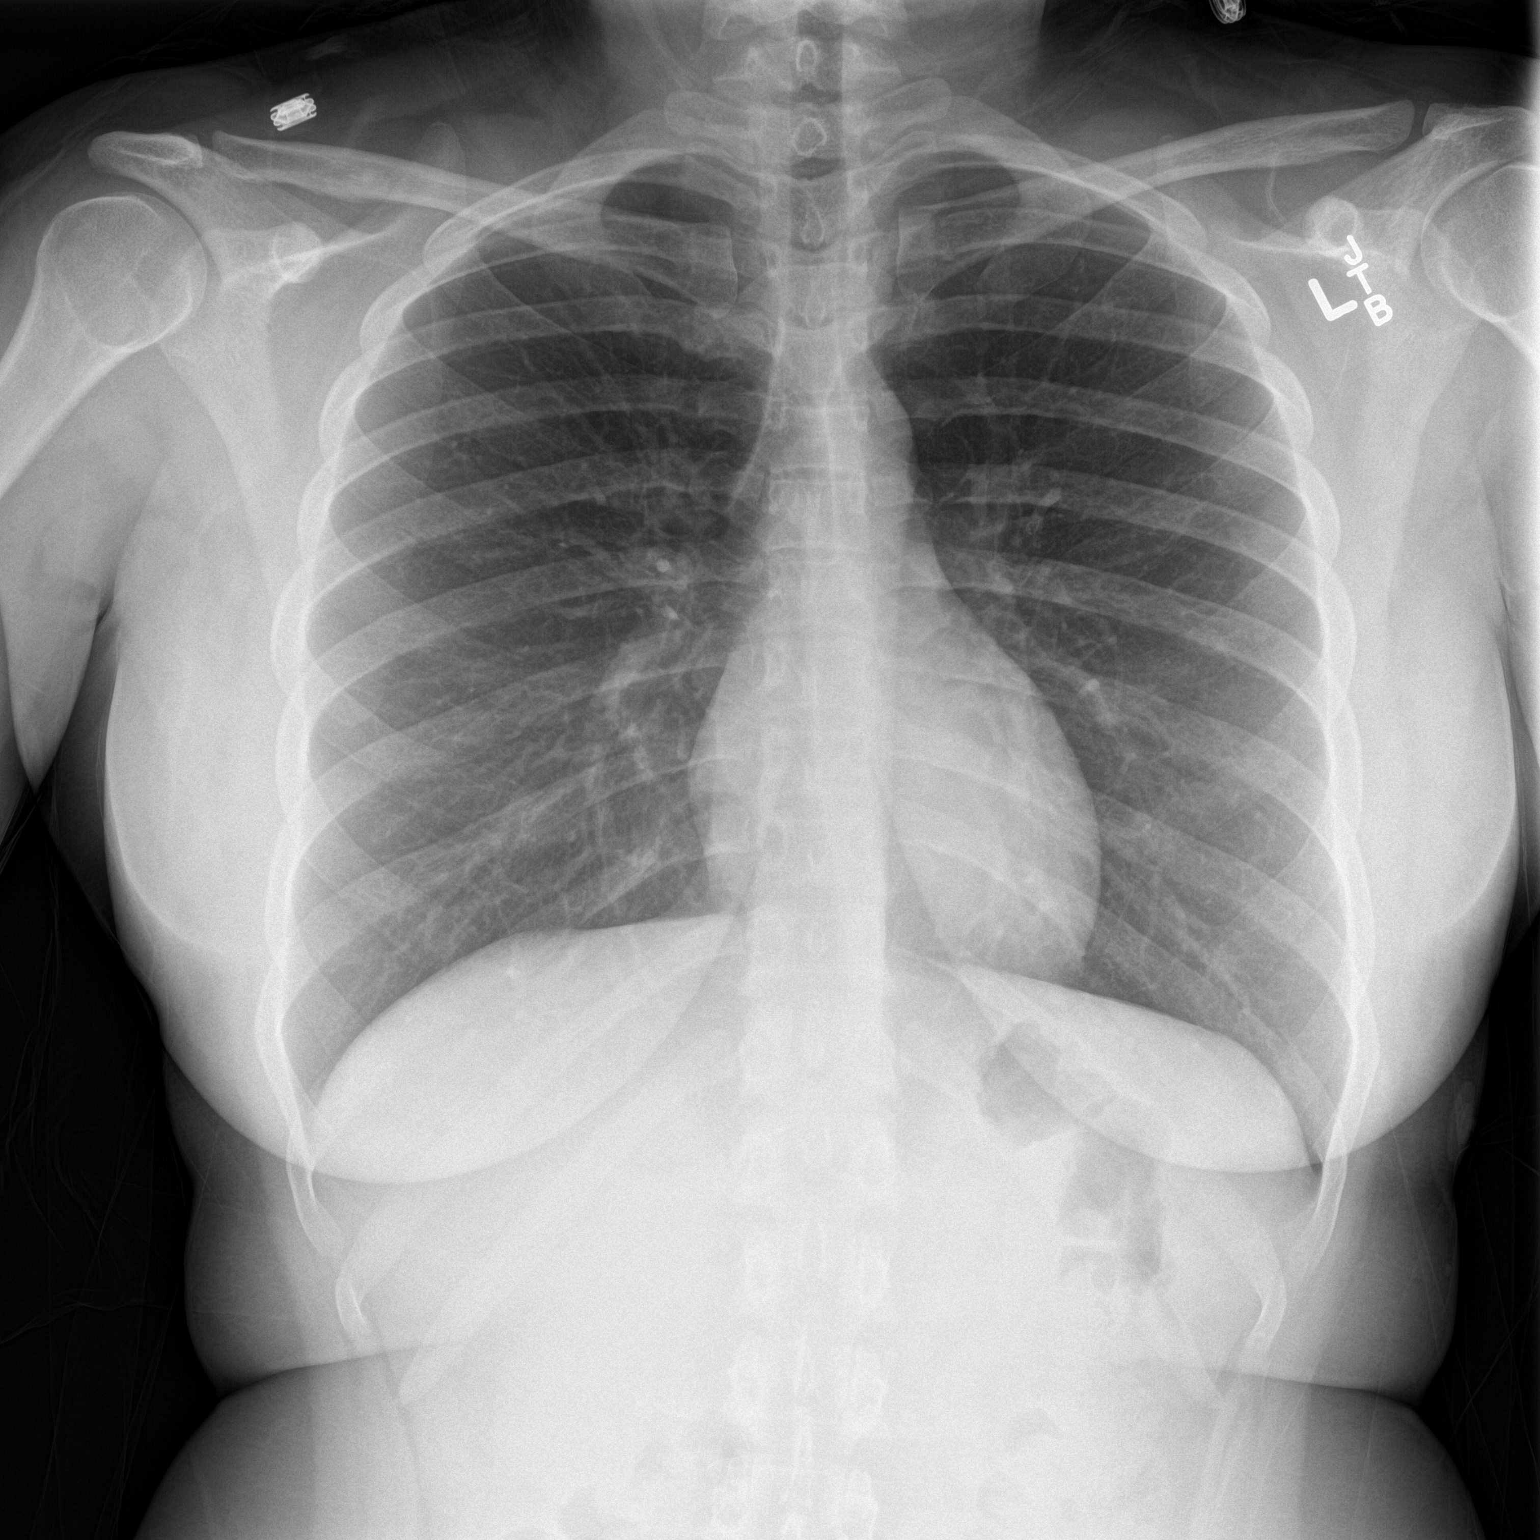

[chest lat]
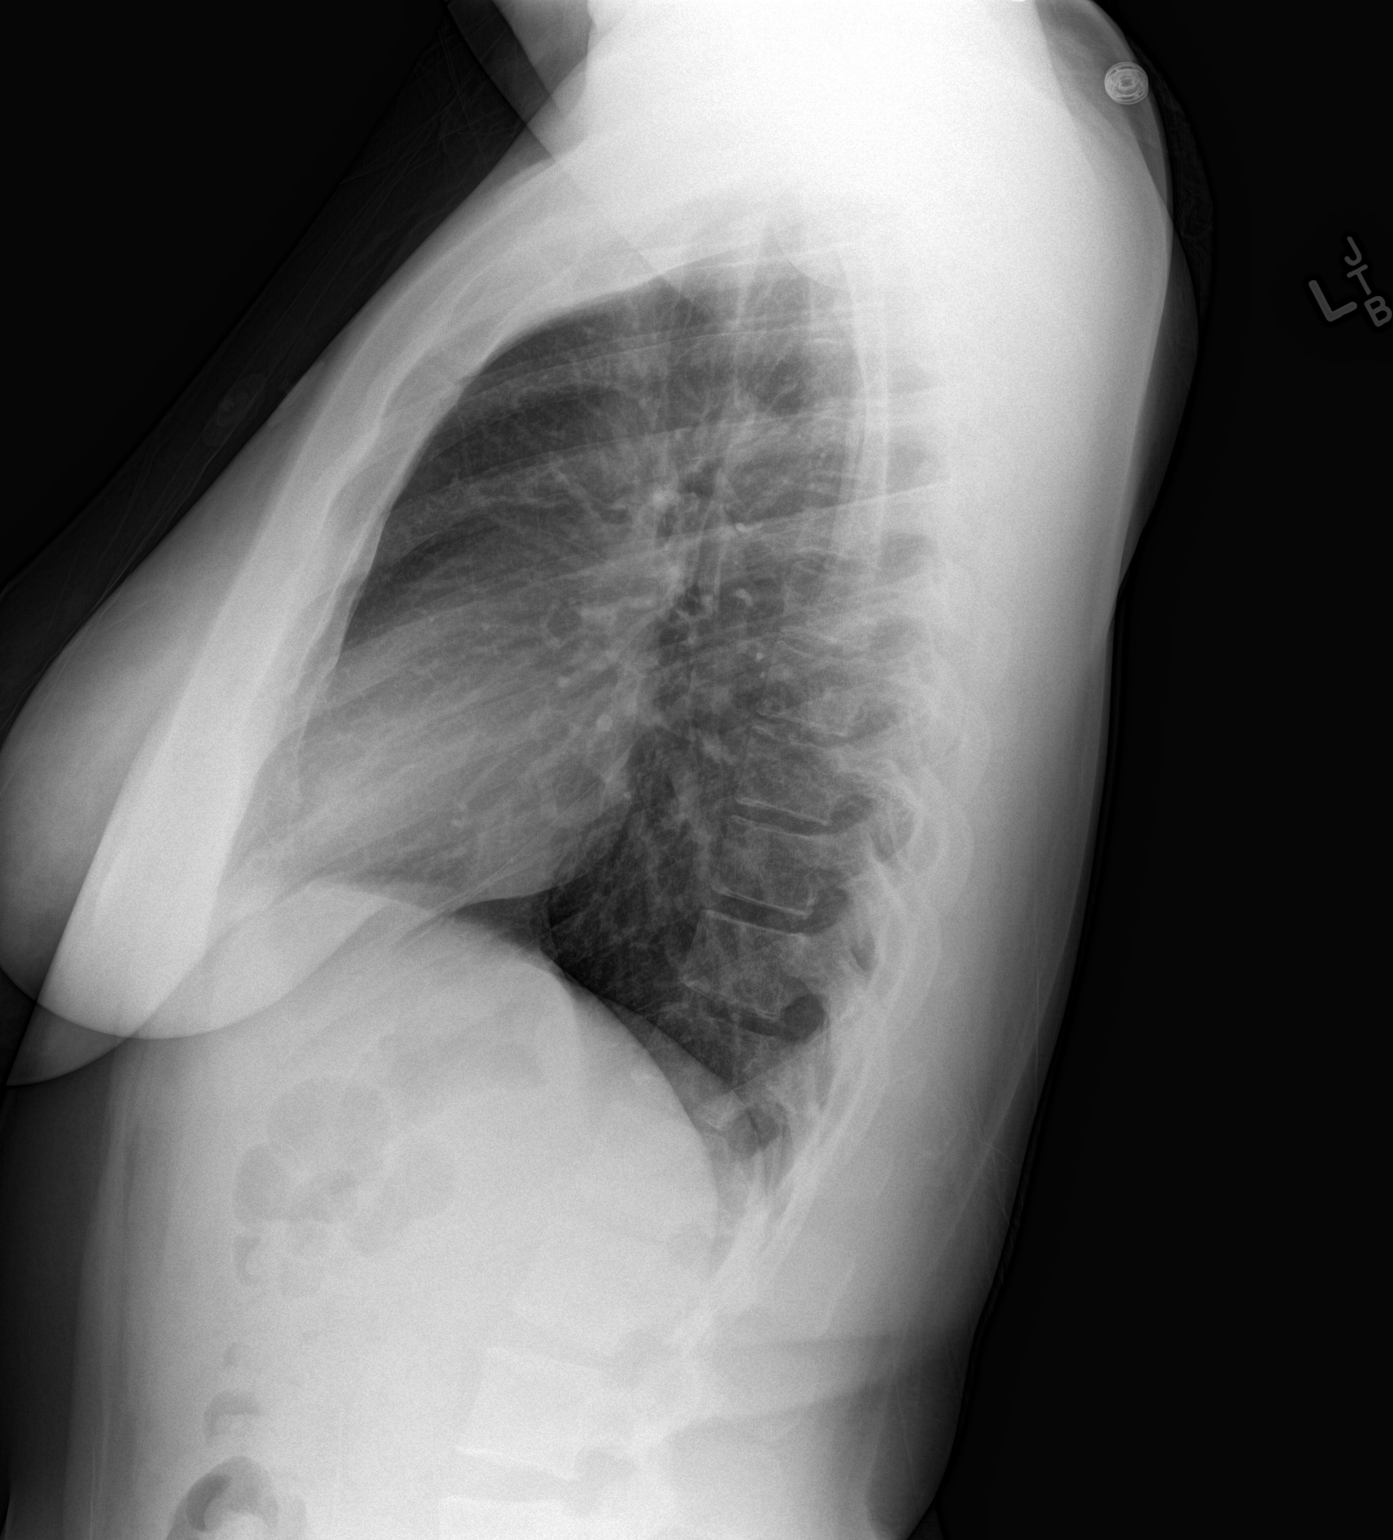

[2 of 2 positions shown; findings below may reference images not displayed]

FINDINGS: Cardiomediastinal silhouette is stable. No acute infiltrate or
pleural effusion. No pulmonary edema. Bony thorax is unremarkable.
IMPRESSION: No active cardiopulmonary disease.

## 2016-11-11 ENCOUNTER — Encounter (HOSPITAL_COMMUNITY): Payer: Self-pay | Admitting: Emergency Medicine

## 2016-11-11 ENCOUNTER — Emergency Department (HOSPITAL_COMMUNITY)
Admission: EM | Admit: 2016-11-11 | Discharge: 2016-11-11 | Disposition: A | Discharge status: Home / Self Care | Payer: Medicaid Other | Attending: Emergency Medicine | Admitting: Emergency Medicine

## 2016-11-11 DIAGNOSIS — R21 Rash and other nonspecific skin eruption: Secondary | ICD-10-CM | POA: Diagnosis present

## 2016-11-11 DIAGNOSIS — F17210 Nicotine dependence, cigarettes, uncomplicated: Secondary | ICD-10-CM | POA: Diagnosis not present

## 2016-11-11 DIAGNOSIS — L299 Pruritus, unspecified: Secondary | ICD-10-CM | POA: Insufficient documentation

## 2016-11-11 MED ORDER — PERMETHRIN 5 % EX CREA: 1.0000 "application " | TOPICAL_CREAM | Freq: Once | CUTANEOUS | 0 refills | Status: AC

## 2016-11-11 NOTE — ED Provider Notes (Signed)
[facility]-EMERGENCY DEPT Provider Note   CSN: [id number] Arrival date & time: 11/11/16  1932     History   Chief Complaint Chief Complaint  Patient presents with  . Rash    HPI Sherry Hayes is a 31 y.o. female.  The history is provided by the patient.  Rash   This is a new problem. The current episode started 2 days ago. The problem has been gradually worsening. The problem is associated with an unknown factor. There has been no fever. The rash is present on the left wrist, right wrist, left fingers, right ankle, left ankle, right arm and left arm. The patient is experiencing no pain. The pain has been constant since onset. Associated symptoms include itching. She has tried nothing for the symptoms.   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 No known scabies exposure. No known exposures to poison ivy or poison oak. No new soaps, laundry, or sheets.  reports that her son started getting the rash as well yesterday.  Past Medical History:  Diagnosis Date  . Abnormal Pap smear    ASCUS +HPV  . Anemia   . 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 Chlamydia   . Dysrhythmia    stress induced- none x 1 year/ "skipping"  . GERD (gastroesophageal reflux disease)    diet related  . Gonorrhea   . Hydrosalpinx   . Pelvic pain   . Vaginal Pap smear, abnormal     Patient Active Problem List   Diagnosis Date Noted  . Endometrioma 02/07/2012  . 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 CESAREAN SECTION    . MASS EXCISION  03/12/2012   Procedure: EXCISION MASS;  Surgeon: Shelly Rubenstein, MD;  Location: [facility];  Service: General;  Laterality: N/A;  Excision of Abdominal Wall Mass    OB History    Gravida Para Term Preterm AB Living   3 1 0 SAB TAB Ectopic Multiple Live Births   1               Home Medications    Prior to  Admission medications   Medication Sig Start Date End Date Taking? Authorizing Provider  acetaminophen (TYLENOL) 500 MG tablet Take 1,000 mg by mouth every 6 (six) hours as needed for mild pain or moderate pain.   Yes [provider]  ibuprofen (ADVIL,MOTRIN) 200 MG tablet Take 400 mg by mouth every 6 (six) hours as needed for moderate pain or cramping.   Yes [provider]  erythromycin ophthalmic ointment Place a 1/2 inch ribbon of ointment into the lower eyelid. Patient not taking: Reported on 11/11/2016 05/05/16   Arby Barrette, MD  oseltamivir (TAMIFLU) 75 MG capsule Take 1 capsule (75 mg total) by mouth every 12 (twelve) hours. Patient not taking: Reported on 11/11/2016 03/05/16   Osie Cheeks    Family History History reviewed. No pertinent family history.  Social History Social History  Substance Use Topics  . Smoking status: Current Every Day Smoker    Packs/day: 0.25    Types: Cigarettes  . Smokeless tobacco: Never Used  . Alcohol use Yes     Comment: occ     Allergies   Latex   Review of Systems Review of Systems  Skin: Positive for itching and rash.  Negative for color change, pallor and wound.     Physical Exam Updated Vital Signs BP 136/77 (BP Location: Left Arm)   Pulse 85   Temp 98.6 F (37 C) (Oral)   Resp 18   LMP 10/12/2016 (Approximate)   SpO2 95%   Physical Exam  Constitutional: She is oriented to person, place, and time. She appears well-developed and well-nourished. No distress.  HENT:  Head: Normocephalic and atraumatic.  Right Ear: External ear normal.  Left Ear: External ear normal.  Nose: Nose normal.  Eyes: Conjunctivae and EOM are normal. No scleral icterus.  Neck: Normal range of motion and phonation normal.  Cardiovascular: Normal rate and regular rhythm.   Pulmonary/Chest: Effort normal. No stridor. No respiratory distress.  Abdominal: She exhibits no distension.  Musculoskeletal: Normal range of motion.  She exhibits no edema.  Neurological: She is alert and oriented to person, place, and time.  Skin: Rash noted. Rash is papular (to the right wrist, forearm, arm, left wrist, forearm, hand, and bilateral ankles.). She is not diaphoretic.  Psychiatric: She has a normal mood and affect. Her behavior is normal.  Vitals reviewed.    ED Treatments / Results  Labs (all labs ordered are listed, but only abnormal results are displayed) Labs Reviewed - No data to display  EKG  EKG Interpretation None       Radiology No results found.  Procedures Procedures (including critical care time)  Medications Ordered in ED Medications - No data to display   Initial Impression / Assessment and Plan / ED Course  I have reviewed the triage vital signs and the nursing notes.  Pertinent labs & imaging results that were available during my care of the patient were reviewed by me and considered in my medical decision making (see chart for details).     Nonspecified rash. Possible bed bugs. Not typical of scabies but will provide treatment for it. Doesn't appear to be contact dermatitis. No associated superimposed infection.  The patient is safe for discharge with strict return precautions.   Final Clinical Impressions(s) / ED Diagnoses   Final diagnoses:  Rash   Disposition: Discharge  Condition: Good  I have discussed the results, Dx and Tx plan with the patient who expressed understanding and agree(s) with the plan. Discharge instructions discussed at great length. The patient was given strict return precautions who verbalized understanding of the instructions. No further questions at time of discharge.    Discharge Medication List as of 11/11/2016 10:45 PM    START taking these medications   Details  permethrin (ELIMITE) 5 % cream Apply 1 application topically once. Apply from neck down Leave on for 8-12hr before washing off, Starting Mon 11/11/2016, Print        Follow  Up: primary care provider  Schedule an appointment as soon as possible for a visit  in 5-7 days, If symptoms do not improve or  worsen      Cardama, Amadeo Garnet, MD 11/12/16 [phone]

## 2016-11-11 NOTE — ED Triage Notes (Signed)
Pt states 3 days ago she started itching  Pt has red raised areas on her right arm and right leg

## 2016-11-21 ENCOUNTER — Ambulatory Visit: Payer: Medicaid Other

## 2017-01-28 ENCOUNTER — Ambulatory Visit (INDEPENDENT_AMBULATORY_CARE_PROVIDER_SITE_OTHER): Payer: Medicaid Other | Admitting: Nurse Practitioner

## 2017-01-28 ENCOUNTER — Other Ambulatory Visit (HOSPITAL_COMMUNITY)
Admission: RE | Admit: 2017-01-28 | Discharge: 2017-01-28 | Disposition: A | Discharge status: Home / Self Care | Payer: Medicaid Other | Source: Ambulatory Visit | Attending: Family Medicine | Admitting: Family Medicine

## 2017-01-28 ENCOUNTER — Encounter: Payer: Self-pay | Admitting: Nurse Practitioner

## 2017-01-28 VITALS — BP 126/76 | HR 94 | Ht 63.5 in | Wt 191.3 lb

## 2017-01-28 DIAGNOSIS — Z01419 Encounter for gynecological examination (general) (routine) without abnormal findings: Secondary | ICD-10-CM | POA: Diagnosis not present

## 2017-01-28 DIAGNOSIS — B9689 Other specified bacterial agents as the cause of diseases classified elsewhere: Secondary | ICD-10-CM | POA: Insufficient documentation

## 2017-01-28 DIAGNOSIS — Z0000 Encounter for general adult medical examination without abnormal findings: Secondary | ICD-10-CM | POA: Diagnosis not present

## 2017-01-28 DIAGNOSIS — R87612 Low grade squamous intraepithelial lesion on cytologic smear of cervix (LGSIL): Secondary | ICD-10-CM | POA: Diagnosis not present

## 2017-01-28 DIAGNOSIS — Z3009 Encounter for other general counseling and advice on contraception: Secondary | ICD-10-CM

## 2017-01-28 DIAGNOSIS — N760 Acute vaginitis: Secondary | ICD-10-CM | POA: Insufficient documentation

## 2017-01-28 DIAGNOSIS — E669 Obesity, unspecified: Secondary | ICD-10-CM | POA: Insufficient documentation

## 2017-01-28 NOTE — Progress Notes (Signed)
GYNECOLOGY ANNUAL PREVENTATIVE CARE ENCOUNTER NOTE  Subjective:   Sherry Hayes is a 31 y.o. [phone] female here for a routine annual gynecologic [place].  Current complaints: wants PAP smear.   Denies abnormal vaginal bleeding, discharge, pelvic pain, problems with intercourse or other gynecologic concerns.  Wants to be checked for all STDs.   Gynecologic History Patient's last menstrual period was 01/14/2017 (exact date). Contraception: none  She has been using condoms but wants to be pregnant so she is not using condoms currently [facility] Results are unknown. Last mammogram: Has not yet had a mammogram as is age 31.  Had an ultrasound of left breast which showed no abnormality and is advised to have screening mammogram at age 31.  Reported having a miscarriage a couple of weeks ago - likely it was a heavy menstrual period as client did not get any medical care with this "miscarriage".  Has history of menses that last 7 days,  Does not want any contraception  - wanting to be pregnant.  Reviewed previous notes and problem list.  Obstetric History OB History  Gravida Para Term Preterm AB Living  3 1 0 1 1 1   SAB TAB Ectopic Multiple Live Births  1            # Outcome Date GA Lbr Len/2nd Weight Sex Delivery Anes PTL Lv  3 Gravida           2 Preterm 03/09/04 [id number]    CS-Unspec     1 SAB               Past Medical History:  Diagnosis Date  . Abnormal Pap smear    ASCUS +HPV  . Anemia   . 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 Chlamydia   . Dysrhythmia    stress induced- none x 1 year/ "skipping"  . GERD (gastroesophageal reflux disease)    diet related  . Gonorrhea   . Hydrosalpinx   . Pelvic pain   . Vaginal Pap smear, abnormal     Past Surgical History:  Procedure Laterality Date  . CESAREAN SECTION    . MASS EXCISION  03/12/2012   Procedure: EXCISION MASS;  Surgeon: Shelly Rubensteinouglas A Blackman, MD;  Location: [facility]  [facility];  Service: General;  Laterality: N/A;  Excision of Abdominal Wall Mass    Current Outpatient Medications on File Prior to Visit  Medication Sig Dispense Refill  . Cholecalciferol (VITAMIN D-3) 1000 units CAPS Take by mouth.    . vitamin C (ASCORBIC ACID) 500 MG tablet Take 500 mg by mouth daily.    Marland Kitchen. acetaminophen (TYLENOL) 500 MG tablet Take 1,000 mg by mouth every 6 (six) hours as needed for mild pain or moderate pain.    Marland Kitchen. erythromycin ophthalmic ointment Place a 1/2 inch ribbon of ointment into the lower eyelid. (Patient not taking: Reported on 11/11/2016) 1 g 0  . ibuprofen (ADVIL,MOTRIN) 200 MG tablet Take 400 mg by mouth every 6 (six) hours as needed for moderate pain or cramping.    Marland Kitchen. oseltamivir (TAMIFLU) 75 MG capsule Take 1 capsule (75 mg total) by mouth every 12 (twelve) hours. (Patient not taking: Reported on 11/11/2016) 10 capsule 0   No current facility-administered medications on file prior to visit.     Allergies  Allergen Reactions  . Latex     Social History   Socioeconomic History  . Marital status:  Single    Spouse name: Not on file  . Number of children: Not on file  . Years of education: Not on file  . Highest education level: Not on file  Social Needs  . Financial resource strain: Not on file  . Food insecurity - worry: Not on file  . Food insecurity - inability: Not on file  . Transportation needs - medical: Not on file  . Transportation needs - non-medical: Not on file  Occupational History  . Not on file  Tobacco Use  . Smoking status: Current Every Day Smoker    Packs/day: 0.25    Types: Cigarettes  . Smokeless tobacco: Never Used  Substance and Sexual Activity  . Alcohol use: Yes    Comment: occ  . Drug use: No  . Sexual activity: Yes    Birth control/protection: None  Other Topics Concern  . Not on file  Social History Narrative  . Not on file    No family history on file.  The following portions of the patient's history were  reviewed and updated as appropriate: allergies, current medications, past family history, past medical history, past social history, past surgical history and problem list.  Review of Systems Pertinent items noted in HPI and remainder of comprehensive ROS otherwise negative.   Objective:  BP 126/76   Pulse 94   Ht 5' 3.5" (1.613 m)   Wt 191 lb 4.8 oz (86.8 kg)   LMP 01/14/2017 (Exact Date)   BMI 33.36 kg/m  CONSTITUTIONAL: Well-developed, well-nourished female in no acute distress.  HENT:  Normocephalic, atraumatic, External right and left ear normal. Oropharynx is clear and moist EYES: Conjunctivae and EOM are normal. Pupils are equal, round, and reactive to light. No scleral icterus.  NECK: Normal range of motion, supple, no masses.  Normal thyroid.  SKIN: Skin is warm and dry. No rash noted. Not diaphoretic. No erythema. No pallor. NEUROLOGIC: Alert and oriented to person, place, and time. Normal reflexes, muscle tone coordination. No cranial nerve deficit noted. PSYCHIATRIC: Normal mood and affect. Normal behavior. Difficult to get history from client.  Suspect some mental delays. CARDIOVASCULAR: Normal heart rate noted, regular rhythm RESPIRATORY: Clear to auscultation bilaterally. Effort and breath sounds normal, no problems with respiration noted. BREASTS: Symmetric in size. No masses, skin changes, nipple drainage, or lymphadenopathy. ABDOMEN: Soft, no distention noted.  No tenderness, rebound or guarding.  PELVIC: Normal appearing external genitalia; normal appearing vaginal mucosa and cervix.  Large amount of mucusy, yellow discharge noted.  Pap smear obtained.  Normal uterine size, no other palpable masses, no uterine or adnexal tenderness. MUSCULOSKELETAL: Normal range of motion. No tenderness.  No cyanosis, clubbing, or edema.    Assessment and Plan:  1. Well female exam with routine gynecological exam Does not want any contraception and is seeking pregnancy. - Cytology -  PAP - HIV antibody (with reflex) - RPR - Hepatitis C Antibody - Hepatitis B Surface AntiGEN - CBC  2. Obesity (BMI 30.0-34.9) Advised weight loss and increase in physical exercise.  Advised weight needs to be under 140 pounds.  3. Family planning education, guidance, and counseling Drink at least 8 8-oz glasses of water every day. Take Tylenol 325 mg 2 tablets by mouth every 4 hours if needed for pain. No smoking, no drugs, no alcohol if planning a pregnancy.   Take a prenatal vitamin one by mouth every day.   Will follow up results of pap smear and manage accordingly.  Routine preventative health maintenance  measures emphasized. Please refer to After Visit Summary for other counseling recommendations.    Nolene BernheimERRI Carisma Troupe, RN, MSN, NP-BC Nurse Practitioner, [facility]

## 2017-01-28 NOTE — Patient Instructions (Addendum)
Calorie Counting for Weight Loss Calories are units of energy. Your body needs a certain amount of calories from food to keep you going throughout the day. When you eat more calories than your body needs, your body stores the extra calories as fat. When you eat fewer calories than your body needs, your body burns fat to get the energy it needs. Calorie counting means keeping track of how many calories you eat and drink each day. Calorie counting can be helpful if you need to lose weight. If you make sure to eat fewer calories than your body needs, you should lose weight. Ask your health care provider what a healthy weight is for you. For calorie counting to work, you will need to eat the right number of calories in a day in order to lose a healthy amount of weight per week. A dietitian can help you determine how many calories you need in a day and will give you suggestions on how to reach your calorie goal.  A healthy amount of weight to lose per week is usually 1-2 lb (0.5-0.9 kg). This usually means that your daily calorie intake should be reduced by 500-750 calories.  Eating 1,200 - 1,500 calories per day can help most women lose weight.  Eating 1,500 - 1,800 calories per day can help most men lose weight.  What is my plan? My goal is to have __________ calories per day. If I have this many calories per day, I should lose around __________ pounds per week. What do I need to know about calorie counting? In order to meet your daily calorie goal, you will need to:  Find out how many calories are in each food you would like to eat. Try to do this before you eat.  Decide how much of the food you plan to eat.  Write down what you ate and how many calories it had. Doing this is called keeping a food log.  To successfully lose weight, it is important to balance calorie counting with a healthy lifestyle that includes regular activity. Aim for 150 minutes of moderate exercise (such as walking) or 75  minutes of vigorous exercise (such as running) each week. Where do I find calorie information?  The number of calories in a food can be found on a Nutrition Facts label. If a food does not have a Nutrition Facts label, try to look up the calories online or ask your dietitian for help. Remember that calories are listed per serving. If you choose to have more than one serving of a food, you will have to multiply the calories per serving by the amount of servings you plan to eat. For example, the label on a package of bread might say that a serving size is 1 slice and that there are 90 calories in a serving. If you eat 1 slice, you will have eaten 90 calories. If you eat 2 slices, you will have eaten 180 calories. How do I keep a food log? Immediately after each meal, record the following information in your food log:  What you ate. Don't forget to include toppings, sauces, and other extras on the food.  How much you ate. This can be measured in cups, ounces, or number of items.  How many calories each food and drink had.  The total number of calories in the meal.  Keep your food log near you, such as in a small notebook in your pocket, or use a mobile app or website. Some   programs will calculate calories for you and show you how many calories you have left for the day to meet your goal. What are some calorie counting tips?  Use your calories on foods and drinks that will fill you up and not leave you hungry: ? Some examples of foods that fill you up are nuts and nut butters, vegetables, lean proteins, and high-fiber foods like whole grains. High-fiber foods are foods with more than 5 g fiber per serving. ? Drinks such as sodas, specialty coffee drinks, alcohol, and juices have a lot of calories, yet do not fill you up.  Eat nutritious foods and avoid empty calories. Empty calories are calories you get from foods or beverages that do not have many vitamins or protein, such as candy, sweets, and  soda. It is better to have a nutritious high-calorie food (such as an avocado) than a food with few nutrients (such as a bag of chips).  Know how many calories are in the foods you eat most often. This will help you calculate calorie counts faster.  Pay attention to calories in drinks. Low-calorie drinks include water and unsweetened drinks.  Pay attention to nutrition labels for "low fat" or "fat free" foods. These foods sometimes have the same amount of calories or more calories than the full fat versions. They also often have added sugar, starch, or salt, to make up for flavor that was removed with the fat.  Find a way of tracking calories that works for you. Get creative. Try different apps or programs if writing down calories does not work for you. What are some portion control tips?  Know how many calories are in a serving. This will help you know how many servings of a certain food you can have.  Use a measuring cup to measure serving sizes. You could also try weighing out portions on a kitchen scale. With time, you will be able to estimate serving sizes for some foods.  Take some time to put servings of different foods on your favorite plates, bowls, and cups so you know what a serving looks like.  Try not to eat straight from a bag or box. Doing this can lead to overeating. Put the amount you would like to eat in a cup or on a plate to make sure you are eating the right portion.  Use smaller plates, glasses, and bowls to prevent overeating.  Try not to multitask (for example, watch TV or use your computer) while eating. If it is time to eat, sit down at a table and enjoy your food. This will help you to know when you are full. It will also help you to be aware of what you are eating and how much you are eating. What are tips for following this plan? Reading food labels  Check the calorie count compared to the serving size. The serving size may be smaller than what you are used to  eating.  Check the source of the calories. Make sure the food you are eating is high in vitamins and protein and low in saturated and trans fats. Shopping  Read nutrition labels while you shop. This will help you make healthy decisions before you decide to purchase your food.  Make a grocery list and stick to it. Cooking  Try to cook your favorite foods in a healthier way. For example, try baking instead of frying.  Use low-fat dairy products. Meal planning  Use more fruits and vegetables. Half of your plate should   be fruits and vegetables.  Include lean proteins like poultry and fish. How do I count calories when eating out?  Ask for smaller portion sizes.  Consider sharing an entree and sides instead of getting your own entree.  If you get your own entree, eat only half. Ask for a box at the beginning of your meal and put the rest of your entree in it so you are not tempted to eat it.  If calories are listed on the menu, choose the lower calorie options.  Choose dishes that include vegetables, fruits, whole grains, low-fat dairy products, and lean protein.  Choose items that are boiled, broiled, grilled, or steamed. Stay away from items that are buttered, battered, fried, or served with cream sauce. Items labeled "crispy" are usually fried, unless stated otherwise.  Choose water, low-fat milk, unsweetened iced tea, or other drinks without added sugar. If you want an alcoholic beverage, choose a lower calorie option such as a glass of wine or light beer.  Ask for dressings, sauces, and syrups on the side. These are usually high in calories, so you should limit the amount you eat.  If you want a salad, choose a garden salad and ask for grilled meats. Avoid extra toppings like bacon, cheese, or fried items. Ask for the dressing on the side, or ask for olive oil and vinegar or lemon to use as dressing.  Estimate how many servings of a food you are given. For example, a serving of  cooked rice is  cup or about the size of half a baseball. Knowing serving sizes will help you be aware of how much food you are eating at restaurants. The list below tells you how big or small some common portion sizes are based on everyday objects: ? 1 oz-4 stacked dice. ? 3 oz-1 deck of cards. ? 1 tsp-1 die. ? 1 Tbsp- a ping-pong ball. ? 2 Tbsp-1 ping-pong ball. ?  cup- baseball. ? 1 cup-1 baseball. Summary  Calorie counting means keeping track of how many calories you eat and drink each day. If you eat fewer calories than your body needs, you should lose weight.  A healthy amount of weight to lose per week is usually 1-2 lb (0.5-0.9 kg). This usually means reducing your daily calorie intake by 500-750 calories.  The number of calories in a food can be found on a Nutrition Facts label. If a food does not have a Nutrition Facts label, try to look up the calories online or ask your dietitian for help.  Use your calories on foods and drinks that will fill you up, and not on foods and drinks that will leave you hungry.  Use smaller plates, glasses, and bowls to prevent overeating. This information is not intended to replace advice given to you by your health care provider. Make sure you discuss any questions you have with your health care provider. Document Released: 02/11/2005 Document Revised: 01/12/2016 Document Reviewed: 01/12/2016 Elsevier Interactive Patient Education  2017 [employer]  Exercising to [employer] Minor[employer]ose Weight Exercising can help you to lose weight. In order to lose weight through exercise, you need to do vigorous-intensity exercise. You can tell that you are exercising with vigorous intensity if you are breathing very hard and fast and cannot hold a conversation while exercising. Moderate-intensity exercise helps to maintain your current weight. You can tell that you are exercising at a moderate level if you have a higher heart rate and faster breathing, but you are still able  to  hold a conversation. How often should I exercise? Choose an activity that you enjoy and set realistic goals. Your health care provider can help you to make an activity plan that works for you. Exercise regularly as directed by your health care provider. This may include:  Doing resistance training twice each week, such as: ? Push-ups. ? Sit-ups. ? Lifting weights. ? Using resistance bands.  Doing a given intensity of exercise for a given amount of time. Choose from these options: ? 150 minutes of moderate-intensity exercise every week. ? 75 minutes of vigorous-intensity exercise every week. ? A mix of moderate-intensity and vigorous-intensity exercise every week.  Children, pregnant women, people who are out of shape, people who are overweight, and older adults may need to consult a health care provider for individual recommendations. If you have any sort of medical condition, be sure to consult your health care provider before starting a new exercise program. What are some activities that can help me to lose weight?  Walking at a rate of at least 4.5 miles an hour.  Jogging or running at a rate of 5 miles per hour.  Biking at a rate of at least 10 miles per hour.  Lap swimming.  Roller-skating or in-line skating.  Cross-country skiing.  Vigorous competitive sports, such as football, basketball, and soccer.  Jumping rope.  Aerobic dancing. How can I be more active in my day-to-day activities?  Use the stairs instead of the elevator.  Take a walk during your lunch break.  If you drive, park your car farther away from work or school.  If you take public transportation, get off one stop early and walk the rest of the way.  Make all of your phone calls while standing up and walking around.  Get up, stretch, and walk around every 30 minutes throughout the day. What guidelines should I follow while exercising?  Do not exercise so much that you hurt yourself, feel dizzy,  or get very short of breath.  Consult your health care provider prior to starting a new exercise program.  Wear comfortable clothes and shoes with good support.  Drink plenty of water while you exercise to prevent dehydration or heat stroke. Body water is lost during exercise and must be replaced.  Work out until you breathe faster and your heart beats faster. This information is not intended to replace advice given to you by your health care provider. Make sure you discuss any questions you have with your health care provider. Document Released: 03/16/2010 Document Revised: 07/20/2015 Document Reviewed: 07/15/2013 Elsevier Interactive Patient Education  2018 [employer]  Steps to Quit Smoking Smoking tobacco can be bad for your health. It can also affect almost every organ in your body. Smoking puts you and people around you at risk for many serious long-lasting (chronic) diseases. Quitting smoking is hard, but it is one of the best things that you can do for your health. It is never too late to quit. What are the benefits of quitting smoking? When you quit smoking, you lower your risk for getting serious diseases and conditions. They can include:  Lung cancer or lung disease.  Heart disease.  Stroke.  Heart attack.  Not being able to have children (infertility).  Weak bones (osteoporosis) and broken bones (fractures).  If you have coughing, wheezing, and shortness of breath, those symptoms may get better when you quit. You may also get sick less often. If you are pregnant, quitting smoking can help to lower your  chances of having a baby of low birth weight. What can I do to help me quit smoking? Talk with your doctor about what can help you quit smoking. Some things you can do (strategies) include:  Quitting smoking totally, instead of slowly cutting back how much you smoke over a period of time.  Going to in-person counseling. You are more likely to quit if you go to many  counseling sessions.  Using resources and support systems, such as: ? Agricultural engineer with a Veterinary surgeon. ? Phone quitlines. ? Automotive engineer. ? Support groups or group counseling. ? Text messaging programs. ? Mobile phone apps or applications.  Taking medicines. Some of these medicines may have nicotine in them. If you are pregnant or breastfeeding, do not take any medicines to quit smoking unless your doctor says it is okay. Talk with your doctor about counseling or other things that can help you.  Talk with your doctor about using more than one strategy at the same time, such as taking medicines while you are also going to in-person counseling. This can help make quitting easier. What things can I do to make it easier to quit? Quitting smoking might feel very hard at first, but there is a lot that you can do to make it easier. Take these steps:  Talk to your family and friends. Ask them to support and encourage you.  Call phone quitlines, reach out to support groups, or work with a Veterinary surgeon.  Ask people who smoke to not smoke around you.  Avoid places that make you want (trigger) to smoke, such as: ? Bars. ? Parties. ? Smoke-break areas at work.  Spend time with people who do not smoke.  Lower the stress in your life. Stress can make you want to smoke. Try these things to help your stress: ? Getting regular exercise. ? Deep-breathing exercises. ? Yoga. ? Meditating. ? Doing a body scan. To do this, close your eyes, focus on one area of your body at a time from head to toe, and notice which parts of your body are tense. Try to relax the muscles in those areas.  Download or buy apps on your mobile phone or tablet that can help you stick to your quit plan. There are many free apps, such as QuitGuide from the [employer] developer for Disease Control and Prevention). You can find more support from smokefree.gov and other websites.  This information is not intended to replace  advice given to you by your health care provider. Make sure you discuss any questions you have with your health care provider. Document Released: 12/08/2008 Document Revised: 10/10/2015 Document Reviewed: 06/28/2014 Elsevier Interactive Patient Education  2018 [employer].

## 2017-01-29 LAB — CBC
HEMATOCRIT: 34.3 % (ref 34.0–46.6)
Hemoglobin: 11.3 g/dL (ref 11.1–15.9)
MCH: 26.6 pg (ref 26.6–33.0)
MCHC: 32.9 g/dL (ref 31.5–35.7)
MCV: 81 fL (ref 79–97)
Platelets: 374 10*3/uL (ref 150–379)
RBC: 4.25 x10E6/uL (ref 3.77–5.28)
RDW: 17.1 % — ABNORMAL HIGH (ref 12.3–15.4)
WBC: 7.9 10*3/uL (ref 3.4–10.8)

## 2017-01-29 LAB — HIV ANTIBODY (ROUTINE TESTING W REFLEX): HIV SCREEN 4TH GENERATION: NONREACTIVE

## 2017-01-29 LAB — RPR: RPR Ser Ql: NONREACTIVE

## 2017-01-29 LAB — HEPATITIS C ANTIBODY: Hep C Virus Ab: <0.1 s/co ratio (ref 0.0–0.9)

## 2017-01-29 LAB — HEPATITIS B SURFACE ANTIGEN: HEP B S AG: NEGATIVE

## 2017-01-30 LAB — CYTOLOGY - PAP
Bacterial vaginitis: POSITIVE — AB
CANDIDA VAGINITIS: NEGATIVE
CHLAMYDIA, DNA PROBE: NEGATIVE
NEISSERIA GONORRHEA: NEGATIVE
TRICH (WINDOWPATH): NEGATIVE

## 2017-02-13 ENCOUNTER — Other Ambulatory Visit: Payer: Self-pay | Admitting: Nurse Practitioner

## 2017-02-13 MED ORDER — METRONIDAZOLE 0.75 % VA GEL
1.0000 | Freq: Every day | VAGINAL | 0 refills | Status: AC
Start: 2017-02-13 — End: 2017-02-18

## 2017-02-13 NOTE — Progress Notes (Signed)
Pap reviewed.  Will schedule for colpo.  Clinic to call her.  Also sent Metrogel to her pharmacy to treat BV which showed up on pap.  Clinical exam was suspicious for BV so will treat based on the lab and clinical appearance. Nolene BernheimERRI Garreth Burnsworth, RN, MSN, NP-BC Nurse Practitioner, [facility], [facility] 02/13/2017 11:07 AM

## 2017-03-18 ENCOUNTER — Encounter: Payer: Medicaid Other | Admitting: Obstetrics

## 2017-04-07 ENCOUNTER — Ambulatory Visit: Payer: Medicaid Other | Admitting: Obstetrics and Gynecology

## 2017-04-17 ENCOUNTER — Ambulatory Visit: Payer: Medicaid Other | Admitting: Obstetrics & Gynecology

## 2017-05-09 ENCOUNTER — Emergency Department (HOSPITAL_COMMUNITY)
Admission: EM | Admit: 2017-05-09 | Discharge: 2017-05-09 | Discharge status: Against Medical Advice | Payer: Medicaid Other

## 2017-05-09 ENCOUNTER — Ambulatory Visit (HOSPITAL_COMMUNITY)
Admission: EM | Admit: 2017-05-09 | Discharge: 2017-05-09 | Disposition: A | Discharge status: Home / Self Care | Payer: Medicaid Other | Attending: Family Medicine | Admitting: Family Medicine

## 2017-05-09 ENCOUNTER — Telehealth (HOSPITAL_COMMUNITY): Payer: Self-pay | Admitting: Emergency Medicine

## 2017-05-09 ENCOUNTER — Encounter (HOSPITAL_COMMUNITY): Payer: Self-pay | Admitting: Emergency Medicine

## 2017-05-09 DIAGNOSIS — S39012A Strain of muscle, fascia and tendon of lower back, initial encounter: Secondary | ICD-10-CM | POA: Diagnosis not present

## 2017-05-09 MED ORDER — PREDNISONE 20 MG PO TABS: ORAL_TABLET | ORAL | 0 refills | Status: DC

## 2017-05-09 NOTE — ED Triage Notes (Signed)
PT reports when she hangs her head down, her lower back hurts. 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

## 2017-05-09 NOTE — Discharge Instructions (Signed)
Return if you are not seeing improvement in 2 days.

## 2017-05-09 NOTE — ED Provider Notes (Signed)
[facility]   [id number] 05/09/17 Arrival Time: 1448   SUBJECTIVE:  Sherry Hayes is a 32 y.o. female who presents to the urgent care with complaint of low back pain made worse by flexing neck.  She works as a Lawyer.  She went to the ED two days in a row.  She was unable to wait 10 hours.    Past Medical History:  Diagnosis Date  . Abnormal Pap smear    ASCUS +HPV  . Anemia   . 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 Chlamydia   . Dysrhythmia    stress induced- none x 1 year/ "skipping"  . GERD (gastroesophageal reflux disease)    diet related  . Gonorrhea   . Hydrosalpinx   . Pelvic pain   . Vaginal Pap smear, abnormal    History reviewed. No pertinent family history. Social History   Socioeconomic History  . Marital status: Single    Spouse name: Not on file  . Number of children: Not on file  . Years of education: Not on file  . Highest education level: Not on file  Social Needs  . Financial resource strain: Not on file  . Food insecurity - worry: Not on file  . Food insecurity - inability: Not on file  . Transportation needs - medical: Not on file  . Transportation needs - non-medical: Not on file  Occupational History  . Not on file  Tobacco Use  . Smoking status: Current Every Day Smoker    Packs/day: 0.25    Types: Cigarettes  . Smokeless tobacco: Never Used  Substance and Sexual Activity  . Alcohol use: Yes    Comment: occ  . Drug use: No  . Sexual activity: Yes    Birth control/protection: None  Other Topics Concern  . Not on file  Social History Narrative  . Not on file   No outpatient medications have been marked as taking for the 05/09/17 encounter [facility] Encounter).   Allergies  Allergen Reactions  . Latex       ROS: As per HPI, remainder of ROS negative.   OBJECTIVE:   Vitals:   05/09/17 1526  BP: 134/65  Pulse: 91  Resp: 16  Temp: 98.4 F (36.9 C)  TempSrc: Oral  SpO2: 100%  Weight:  185 lb (83.9 kg)     General appearance: alert; no distress Eyes: PERRL; EOMI; conjunctiva normal HENT: normocephalic; atraumatic; oral mucosa normal Neck: supple Lungs: clear to auscultation bilaterally Heart: regular rate and rhythm Back: no CVA tenderness Extremities: no cyanosis or edema; symmetrical with no gross deformities Skin: warm and dry Neurologic: normal gait; grossly normal Psychological: alert and cooperative; normal mood and affect      Labs:  Results for orders placed or performed in visit on 01/28/17  HIV antibody (with reflex)  Result Value Ref Range   HIV Screen 4th Generation wRfx Non Reactive Non Reactive  RPR  Result Value Ref Range   RPR Ser Ql Non Reactive Non Reactive  Hepatitis C Antibody  Result Value Ref Range   Hep C Virus Ab <0.1 0.0 - 0.9 s/co ratio  Hepatitis B Surface AntiGEN  Result Value Ref Range   Hepatitis B Surface Ag Negative Negative  CBC  Result Value Ref Range   WBC 7.9 3.4 - 10.8 x10E3/uL   RBC 4.25 3.77 - 5.28 x10E6/uL   Hemoglobin 11.3 11.1 - 15.9 g/dL   Hematocrit 40.9 81.1 - 46.6 %  MCV 81 79 - 97 fL   MCH 26.6 26.6 - 33.0 pg   MCHC 32.9 31.5 - 35.7 g/dL   RDW 16.117.1 (H) 09.612.3 - 04.515.4 %   Platelets 374 150 - 379 x10E3/uL  Cytology - PAP  Result Value Ref Range   Adequacy (A)     Satisfactory for evaluation  endocervical/transformation zone component PRESENT.   Diagnosis (A)     LOW GRADE SQUAMOUS INTRAEPITHELIAL LESION: CIN-1/ HPV (LSIL).   Bacterial vaginitis POSITIVE for Gardnerella vaginalis (A)    Candida vaginitis Negative for Candida species    Chlamydia Negative    Neisseria gonorrhea Negative    Trichomonas Negative    Material Submitted CervicoVaginal Pap [ThinPrep Imaged] (A)    CYTOLOGY - PAP PAP RESULT     Labs Reviewed - No data to display  No results found.     ASSESSMENT & PLAN:  1. Strain of lumbar region, initial encounter     Meds ordered this encounter  Medications  . predniSONE  (DELTASONE) 20 MG tablet    Sig: Two daily with food    Dispense:  10 tablet    Refill:  0    Reviewed expectations re: course of current medical issues. Questions answered. Outlined signs and symptoms indicating need for more acute intervention. Patient verbalized understanding. After Visit Summary given.    Procedures:      Elvina SidleLauenstein, Morgane Joerger, MD 05/09/17 [phone]

## 2017-12-12 ENCOUNTER — Encounter: Payer: Self-pay | Admitting: General Practice

## 2017-12-12 ENCOUNTER — Ambulatory Visit: Payer: Medicaid Other | Admitting: Obstetrics & Gynecology

## 2017-12-12 NOTE — Progress Notes (Unsigned)
Patient no showed for appt today, per Dr Harraway Smith patient can reschedule on her own. 

## 2017-12-31 ENCOUNTER — Other Ambulatory Visit (HOSPITAL_COMMUNITY)
Admission: RE | Admit: 2017-12-31 | Discharge: 2017-12-31 | Disposition: A | Discharge status: Home / Self Care | Payer: Medicaid Other | Source: Ambulatory Visit | Attending: Nurse Practitioner | Admitting: Nurse Practitioner

## 2017-12-31 ENCOUNTER — Encounter: Payer: Self-pay | Admitting: Nurse Practitioner

## 2017-12-31 ENCOUNTER — Ambulatory Visit (INDEPENDENT_AMBULATORY_CARE_PROVIDER_SITE_OTHER): Payer: Medicaid Other | Admitting: Nurse Practitioner

## 2017-12-31 VITALS — BP 135/75 | HR 89 | Wt 196.1 lb

## 2017-12-31 DIAGNOSIS — Z8742 Personal history of other diseases of the female genital tract: Secondary | ICD-10-CM | POA: Diagnosis not present

## 2017-12-31 DIAGNOSIS — Z01419 Encounter for gynecological examination (general) (routine) without abnormal findings: Secondary | ICD-10-CM | POA: Insufficient documentation

## 2017-12-31 LAB — POCT PREGNANCY, URINE: Preg Test, Ur: NEGATIVE

## 2017-12-31 NOTE — Progress Notes (Signed)
   GYNECOLOGY OFFICE VISIT NOTE   History:  32 y.o. [id number] here today for repeat pap smear only.  Annual exam not due until January. She denies any abnormal vaginal discharge, bleeding, or pelvic pain.  Thinking she might be pregnant so Pregnancy test done - negative.  Reports not using a condom one time.  Past Medical History:  Diagnosis Date  . Abnormal Pap smear    ASCUS +HPV  . Anemia   . 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 Chlamydia   . Dysrhythmia    stress induced- none x 1 year/ "skipping"  . GERD (gastroesophageal reflux disease)    diet related  . Gonorrhea   . Hydrosalpinx   . Pelvic pain   . Vaginal Pap smear, abnormal     Past Surgical History:  Procedure Laterality Date  . CESAREAN SECTION    . MASS EXCISION  03/12/2012   Procedure: EXCISION MASS;  Surgeon: Shelly Rubenstein, MD;  Location: [facility];  Service: General;  Laterality: N/A;  Excision of Abdominal Wall Mass    The following portions of the patient's history were reviewed and updated as appropriate: allergies, current medications, past family history, past medical history, past social history, past surgical history and problem list.    Review of Systems:  Pertinent items noted in HPI and remainder of comprehensive ROS otherwise negative.  Objective:  Physical Exam BP 135/75   Pulse 89   Wt 196 lb 1.6 oz (89 kg)   BMI 34.19 kg/m  CONSTITUTIONAL: Well-developed, well-nourished female in no acute distress.  HENT:  Normocephalic, atraumatic. External right and left ear normal.  EYES: Conjunctivae and EOM are normal. Pupils are equal, round.  No scleral icterus.  NECK: Normal range of motion, supple, no masses SKIN: Skin is warm and dry. No rash noted. Not diaphoretic. No erythema. No pallor. NEUROLOGIC: Alert and oriented to person, place, and time. Normal muscle tone coordination. No cranial nerve deficit noted. PSYCHIATRIC: Normal mood and affect. Normal behavior.  Normal judgment and thought content. ABDOMEN: Soft, no distention noted.   PELVIC: Normal appearing external genitalia; normal appearing vaginal mucosa and cervix.  No abnormal discharge noted.  Normal uterine size, no other palpable masses, no uterine or adnexal tenderness. Pap done MUSCULOSKELETAL: Normal range of motion. No edema noted.  Labs and Imaging Pregnancy test done.  Assessment & Plan:  1. History of abnormal cervical Papanicolaou smear Missed colpo appointment in October of 2019 and now needs pap redone - Cytology - PAP   Routine preventative health maintenance measures emphasized. Please refer to After Visit Summary for other counseling recommendations.   Return in about 2 months (around 03/02/2018).   Total face-to-face time with patient: 15 minutes.  Over 50% of encounter was spent on counseling and coordination of care.  Nolene Bernheim, RN, MSN, NP-BC Nurse Practitioner, [facility] for [employer], [facility] 12/31/2017 1:48 PM

## 2018-01-02 LAB — CYTOLOGY - PAP
CHLAMYDIA, DNA PROBE: NEGATIVE
DIAGNOSIS: UNDETERMINED — AB
HPV: DETECTED — AB
NEISSERIA GONORRHEA: NEGATIVE

## 2018-01-06 ENCOUNTER — Telehealth: Payer: Self-pay

## 2018-01-06 NOTE — Telephone Encounter (Addendum)
-----   Message from Currie Pariserri L Burleson, NP sent at 01/06/2018  4:10 PM EST ----- Pap smear is abnormal and next step would be colposcopy.  Please call client and schedule.  Missed her previous colpo appointment.  Notified pt of her results that someone from the front office will call her to schedule an appt.  Pt stated understanding with no further questions.

## 2018-01-07 ENCOUNTER — Telehealth: Payer: Self-pay | Admitting: Family Medicine

## 2018-01-07 NOTE — Telephone Encounter (Signed)
LVM with colpo appointment. Left office number if needing to reschedule. Also mailed out appointment reminder

## 2018-02-06 ENCOUNTER — Ambulatory Visit (INDEPENDENT_AMBULATORY_CARE_PROVIDER_SITE_OTHER): Payer: Medicaid Other | Admitting: Obstetrics & Gynecology

## 2018-02-06 ENCOUNTER — Other Ambulatory Visit (HOSPITAL_COMMUNITY)
Admission: RE | Admit: 2018-02-06 | Discharge: 2018-02-06 | Disposition: A | Discharge status: Home / Self Care | Payer: Medicaid Other | Source: Ambulatory Visit | Attending: Obstetrics & Gynecology | Admitting: Obstetrics & Gynecology

## 2018-02-06 ENCOUNTER — Encounter: Payer: Self-pay | Admitting: Obstetrics & Gynecology

## 2018-02-06 VITALS — BP 139/81 | HR 87 | Ht 64.0 in | Wt 198.6 lb

## 2018-02-06 DIAGNOSIS — R87612 Low grade squamous intraepithelial lesion on cytologic smear of cervix (LGSIL): Secondary | ICD-10-CM | POA: Insufficient documentation

## 2018-02-06 DIAGNOSIS — N870 Mild cervical dysplasia: Secondary | ICD-10-CM

## 2018-02-06 LAB — POCT PREGNANCY, URINE: Preg Test, Ur: NEGATIVE

## 2018-02-06 NOTE — Progress Notes (Signed)
   Subjective:    Patient ID: Sherry Hayes, female    DOB: 1985-07-14, 32 y.o.   MRN: [id number]  HPI 32 yo [id number] here for a colpo. 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 She had a pap 12/18 that showed the same thing. She did not keep her appt for a colpo then. She has not used contraception for 2 years, wants a pregnancy.  Review of Systems     Objective:   Physical Exam Breathing, conversing, and ambulating normally Well nourished, well hydrated Black female, no apparent distress Abd- benign UPT negative, consent signed, time out done Cervix prepped with acetic acid. Transformation zone seen in its entirety. Colpo adequate. Changes c/w LGSIL (acetowhite changes) at the 11 o'clock position I biopsied this area. Silver nitrate achieved hemostasis. ECC obtained. She tolerated the procedure well.     Assessment & Plan:  LGSIL- c/w colpo Await pathology Rec Gardasil Come back for results Rec start MVI daily to help prevent ONTDs Dr. Lyndal RainbowYalcinkaya's info given

## 2018-02-06 NOTE — Patient Instructions (Signed)
Human Papillomavirus Quadrivalent Vaccine suspension for injection What is this medicine? HUMAN PAPILLOMAVIRUS VACCINE (HYOO muhn pap uh LOH muh vahy ruhs vak SEEN) is a vaccine. It is used to prevent infections of four types of the human papillomavirus. In women, the vaccine may lower your risk of getting cervical, vaginal, vulvar, or anal cancer and genital warts. In men, the vaccine may lower your risk of getting genital warts and anal cancer. You cannot get these diseases from the vaccine. This vaccine does not treat these diseases. This medicine may be used for other purposes; ask your health care provider or pharmacist if you have questions. COMMON BRAND NAME(S): Gardasil What should I tell my health care provider before I take this medicine? They need to know if you have any of these conditions: -fever or infection -hemophilia -HIV infection or AIDS -immune system problems -low platelet count -an unusual reaction to Human Papillomavirus Vaccine, yeast, other medicines, foods, dyes, or preservatives -pregnant or trying to get pregnant -breast-feeding How should I use this medicine? This vaccine is for injection in a muscle on your upper arm or thigh. It is given by a health care professional. [name] will be observed for 15 minutes after each dose. Sometimes, fainting happens after the vaccine is given. You may be asked to sit or lie down during the 15 minutes. Three doses are given. The second dose is given 2 months after the first dose. The last dose is given 4 months after the second dose. A copy of a Vaccine Information Statement will be given before each vaccination. Read this sheet carefully each time. The sheet may change frequently. Talk to your pediatrician regarding the use of this medicine in children. While this drug may be prescribed for children as young as 43 years of age for selected conditions, precautions do apply. Overdosage: If you think you have taken too much of this  medicine contact a poison control center or emergency room at once. NOTE: This medicine is only for you. Do not share this medicine with others. What if I miss a dose? All 3 doses of the vaccine should be given within 6 months. Remember to keep appointments for follow-up doses. Your health care provider will tell you when to return for the next vaccine. Ask your health care professional for advice if you are unable to keep an appointment or miss a scheduled dose. What may interact with this medicine? -other vaccines This list may not describe all possible interactions. Give your health care provider a list of all the medicines, herbs, non-prescription drugs, or dietary supplements you use. Also tell them if you smoke, drink alcohol, or use illegal drugs. Some items may interact with your medicine. What should I watch for while using this medicine? This vaccine may not fully protect everyone. Continue to have regular pelvic exams and cervical or anal cancer screenings as directed by your doctor. The Human Papillomavirus is a sexually transmitted disease. It can be passed by any kind of sexual activity that involves genital contact. The vaccine works best when given before you have any contact with the virus. Many people who have the virus do not have any signs or symptoms. Tell your doctor or health care professional if you have any reaction or unusual symptom after getting the vaccine. What side effects may I notice from receiving this medicine? Side effects that you should report to your doctor or health care professional as soon as possible: -allergic reactions like skin rash, itching or hives, swelling  of the face, lips, or tongue -breathing problems -feeling faint or lightheaded, falls Side effects that usually do not require medical attention (report to your doctor or health care professional if they continue or are bothersome): -cough -dizziness -fever -headache -nausea -redness, warmth,  swelling, pain, or itching at site where injected This list may not describe all possible side effects. Call your doctor for medical advice about side effects. You may report side effects to FDA at 1-800-FDA-1088. Where should I keep my medicine? This drug is given in a hospital or clinic and will not be stored at home. NOTE: This sheet is a summary. It may not cover all possible information. If you have questions about this medicine, talk to your doctor, pharmacist, or health care provider.  2018 Elsevier/Gold Standard (2013-04-05 13:14:33)

## 2018-03-02 ENCOUNTER — Telehealth: Payer: Self-pay | Admitting: Family Medicine

## 2018-03-02 ENCOUNTER — Ambulatory Visit (INDEPENDENT_AMBULATORY_CARE_PROVIDER_SITE_OTHER): Payer: Medicaid Other | Admitting: General Practice

## 2018-03-02 VITALS — BP 131/76 | HR 82 | Ht 64.0 in | Wt 200.0 lb

## 2018-03-02 DIAGNOSIS — Z111 Encounter for screening for respiratory tuberculosis: Secondary | ICD-10-CM

## 2018-03-02 MED ORDER — TUBERCULIN PPD 5 UNIT/0.1ML ID SOLN
5.0000 [IU] | Freq: Once | INTRADERMAL | Status: AC
  Administered 2018-03-02: 5 [IU] via INTRADERMAL

## 2018-03-02 NOTE — Telephone Encounter (Signed)
Left a message to inform the patient our hospital is currently under flu restrictions. °

## 2018-03-02 NOTE — Progress Notes (Signed)
I have reviewed the chart and agree with nursing staff's documentation of this patient's encounter.  Hazleton Bing, MD 03/02/2018 1:39 PM

## 2018-03-02 NOTE — Progress Notes (Signed)
Patient presents to office today for TB skin test. Injection administered in right forearm. Patient will return 1/8 for reading.  Chase Caller RN BSN 03/02/18

## 2018-03-03 ENCOUNTER — Ambulatory Visit: Payer: Medicaid Other | Admitting: Obstetrics & Gynecology

## 2018-03-04 ENCOUNTER — Ambulatory Visit (INDEPENDENT_AMBULATORY_CARE_PROVIDER_SITE_OTHER): Payer: Medicaid Other

## 2018-03-04 DIAGNOSIS — Z111 Encounter for screening for respiratory tuberculosis: Secondary | ICD-10-CM | POA: Diagnosis not present

## 2018-03-04 NOTE — Progress Notes (Signed)
Pt here today for PPD skin test reading.  Zero induration observed.  Pt given letter for negative PPD screening.

## 2018-03-04 NOTE — Progress Notes (Signed)
I have reviewed this chart and agree with the RN/CMA assessment and management.    K. Meryl Davis, M.D. Attending [facility] (Faculty Practice)   

## 2018-10-13 ENCOUNTER — Telehealth: Payer: Self-pay | Admitting: Family Medicine

## 2018-10-13 NOTE — Telephone Encounter (Signed)
Attempted to call patient about her appointment on 8/19 @ 3:20. No answer, left voicemail instructing patient to wear a face mask for the entire appointment and no visitors are allowed during the visit. Patient instructed not to attend the appointment if she was any symptoms. Symptom list and office number left.

## 2018-10-14 ENCOUNTER — Ambulatory Visit: Payer: Medicaid Other

## 2018-10-21 ENCOUNTER — Ambulatory Visit (HOSPITAL_COMMUNITY)
Admission: EM | Admit: 2018-10-21 | Discharge: 2018-10-21 | Disposition: A | Discharge status: Home / Self Care | Payer: Medicaid Other | Attending: Internal Medicine | Admitting: Internal Medicine

## 2018-10-21 ENCOUNTER — Other Ambulatory Visit: Payer: Self-pay

## 2018-10-21 ENCOUNTER — Encounter (HOSPITAL_COMMUNITY): Payer: Self-pay

## 2018-10-21 DIAGNOSIS — S43411A Sprain of right coracohumeral (ligament), initial encounter: Secondary | ICD-10-CM

## 2018-10-21 DIAGNOSIS — M25511 Pain in right shoulder: Secondary | ICD-10-CM | POA: Diagnosis not present
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
## 2018-10-26 ENCOUNTER — Telehealth: Payer: Self-pay | Admitting: Family Medicine

## 2018-10-26 NOTE — Telephone Encounter (Signed)
Attempted to call patient about her appointment on 9/1 @ 10:00. No answer left voicemail instructing patient to wear a face mask for the entire appointment and no visitors are allowed during the visit. Patient instructed not to attend the appointment if she was any symptoms. Symptom list and office number left.

## 2018-10-27 ENCOUNTER — Ambulatory Visit: Payer: Medicaid Other

## 2019-02-01 ENCOUNTER — Ambulatory Visit (HOSPITAL_COMMUNITY)
Admission: EM | Admit: 2019-02-01 | Discharge: 2019-02-01 | Disposition: A | Discharge status: Home / Self Care | Payer: Medicaid Other | Attending: Family Medicine | Admitting: Family Medicine

## 2019-02-01 ENCOUNTER — Other Ambulatory Visit: Payer: Self-pay

## 2019-02-01 ENCOUNTER — Encounter (HOSPITAL_COMMUNITY): Payer: Self-pay

## 2019-02-01 DIAGNOSIS — S46912A Strain of unspecified muscle, fascia and tendon at shoulder and upper arm level, left arm, initial encounter: Secondary | ICD-10-CM | POA: Diagnosis not present

## 2019-02-01 DIAGNOSIS — R590 Localized enlarged lymph nodes: Secondary | ICD-10-CM | POA: Diagnosis not present

## 2019-02-01 MED ORDER — PREDNISONE 20 MG PO TABS: ORAL_TABLET | ORAL | 0 refills | Status: AC

## 2019-02-01 NOTE — ED Provider Notes (Signed)
[facility]    CSN: [id number] Arrival date & time: 02/01/19  1705      History   Chief Complaint Chief Complaint  Patient presents with  . Shoulder Pain  . Facial Swelling    underneath her chin    HPI Sherry Hayes is a 33 y.o. female.   33 yo established [place] patient.  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 This happened a week ago.  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  No swelling.  Does have tenderness just medial to the anterior shoulder joint line.  Able to move shoulder in all directions.  Also has some swollen submandibular glands.  Brushed her teeth hard last week with a new toothbrush.  Smokes and is worried about mouth cancer.     Past Medical History:  Diagnosis Date  . Abnormal Pap smear    ASCUS +HPV  . Anemia   . 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 Chlamydia   . Dysrhythmia    stress induced- none x 1 year/ "skipping"  . GERD (gastroesophageal reflux disease)    diet related  . Gonorrhea   . Hydrosalpinx   . Pelvic pain   . Vaginal Pap smear, abnormal     Patient Active Problem List   Diagnosis Date Noted  . Obesity (BMI 30.0-34.9) 01/28/2017  . Endometrioma 02/07/2012  . 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 CESAREAN SECTION    . MASS EXCISION  03/12/2012   Procedure: EXCISION MASS;  Surgeon: Harl Bowie, MD;  Location: [facility];  Service: General;  Laterality: N/A;  Excision of Abdominal Wall Mass    OB History    Gravida  3   Para  1   Term  0   Preterm  1   AB  2   Living  1     SAB  2   TAB      Ectopic      Multiple      Live Births               Home Medications    Prior to Admission medications   Medication Sig Start Date End Date Taking? Authorizing Provider  acetaminophen (TYLENOL) 500 MG tablet Take 1,000 mg  by mouth every 6 (six) hours as needed for mild pain or moderate pain.    [provider]  Cholecalciferol (VITAMIN D-3) 1000 units CAPS Take by mouth.    [provider]  predniSONE (DELTASONE) 20 MG tablet Two daily with food 02/01/19   Robyn Haber, MD  vitamin C (ASCORBIC ACID) 500 MG tablet Take 500 mg by mouth daily.    [provider]    Family History History reviewed. No pertinent family history.  Social History Social History   Tobacco Use  . Smoking status: Current Every Day Smoker    Packs/day: 0.25    Types: Cigarettes  . Smokeless tobacco: Never Used  Substance Use Topics  . Alcohol use: Yes    Comment: occ  . Drug use: No     Allergies   Latex   Review of Systems Review of Systems  All other systems reviewed and are negative.    Physical Exam Triage Vital Signs ED Triage Vitals [02/01/19 1740]  Enc Vitals Group  BP 130/73     Pulse Rate 84     Resp 16     Temp 98.4 F (36.9 C)     Temp Source Oral     SpO2 100 %     Weight      Height      Head Circumference      Peak Flow      Pain Score 2     Pain Loc      Pain Edu?      Excl. in GC?    No data found.  Updated Vital Signs BP 130/73 (BP Location: Right Arm)   Pulse 84   Temp 98.4 F (36.9 C) (Oral)   Resp 16   LMP 01/11/2019   SpO2 100%    Physical Exam Vitals signs and nursing note reviewed.  Constitutional:      Appearance: Normal appearance. She is normal weight.  HENT:     Head: Normocephalic and atraumatic.     Right Ear: External ear normal.     Left Ear: External ear normal.     Nose: Nose normal.     Mouth/Throat:     Pharynx: Oropharynx is clear.  Eyes:     Conjunctiva/sclera: Conjunctivae normal.  Neck:     Musculoskeletal: Normal range of motion and neck supple.  Cardiovascular:     Rate and Rhythm: Normal rate.  Pulmonary:     Effort: Pulmonary effort is normal.  Musculoskeletal: Normal range of motion.        General:  Tenderness and signs of injury present. No swelling or deformity.  Lymphadenopathy:     Cervical: Cervical adenopathy present.  Skin:    General: Skin is warm and dry.  Neurological:     General: No focal deficit present.     Mental Status: She is alert.  Psychiatric:        Mood and Affect: Mood normal.        Behavior: Behavior normal.      UC Treatments / Results  Labs (all labs ordered are listed, but only abnormal results are displayed) Labs Reviewed - No data to display  EKG   Radiology No results found.  Procedures Procedures (including critical care time)  Medications Ordered in UC Medications - No data to display  Initial Impression / Assessment and Plan / UC Course  I have reviewed the triage vital signs and the nursing notes.  Pertinent labs & imaging results that were available during my care of the patient were reviewed by me and considered in my medical decision making (see chart for details).    Final Clinical Impressions(s) / UC Diagnoses   Final diagnoses:  Shoulder strain, left, initial encounter  Submental adenopathy     Discharge Instructions     Continue gentle range of motion and return at end of week if symptoms persist.  Gargle with listerine twice a day.    ED Prescriptions    Medication Sig Dispense Auth. Provider   predniSONE (DELTASONE) 20 MG tablet Two daily with food 10 tablet Elvina Sidle, MD     I have reviewed the PDMP during this encounter.   Elvina Sidle, MD 02/01/19 [phone]

## 2019-02-01 NOTE — ED Triage Notes (Signed)
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

## 2019-02-01 NOTE — Discharge Instructions (Addendum)
Continue gentle range of motion and return at end of week if symptoms persist.  Gargle with listerine twice a day.

## 2019-10-21 ENCOUNTER — Emergency Department (HOSPITAL_COMMUNITY): Payer: Medicaid Other

## 2019-10-21 ENCOUNTER — Other Ambulatory Visit: Payer: Self-pay

## 2019-10-21 ENCOUNTER — Encounter (HOSPITAL_COMMUNITY): Payer: Self-pay | Admitting: Emergency Medicine

## 2019-10-21 ENCOUNTER — Emergency Department (HOSPITAL_COMMUNITY)
Admission: EM | Admit: 2019-10-21 | Discharge: 2019-10-21 | Disposition: A | Discharge status: Home / Self Care | Payer: Medicaid Other | Attending: Emergency Medicine | Admitting: Emergency Medicine

## 2019-10-21 DIAGNOSIS — F17210 Nicotine dependence, cigarettes, uncomplicated: Secondary | ICD-10-CM | POA: Diagnosis not present

## 2019-10-21 DIAGNOSIS — S99922A Unspecified injury of left foot, initial encounter: Secondary | ICD-10-CM

## 2019-10-21 DIAGNOSIS — X58XXXA Exposure to other specified factors, initial encounter: Secondary | ICD-10-CM | POA: Diagnosis not present

## 2019-10-21 DIAGNOSIS — Z79899 Other long term (current) drug therapy: Secondary | ICD-10-CM | POA: Diagnosis not present

## 2019-10-21 DIAGNOSIS — Y999 Unspecified external cause status: Secondary | ICD-10-CM | POA: Diagnosis not present

## 2019-10-21 DIAGNOSIS — Y9289 Other specified places as the place of occurrence of the external cause: Secondary | ICD-10-CM | POA: Diagnosis not present

## 2019-10-21 DIAGNOSIS — Y9389 Activity, other specified: Secondary | ICD-10-CM | POA: Diagnosis not present

## 2019-10-21 DIAGNOSIS — S90932A Unspecified superficial injury of left great toe, initial encounter: Secondary | ICD-10-CM | POA: Diagnosis present

## 2019-10-21 DIAGNOSIS — Z91040 Latex allergy status: Secondary | ICD-10-CM | POA: Diagnosis not present

## 2019-10-21 MED ORDER — CEPHALEXIN 500 MG PO CAPS: 500.0000 mg | ORAL_CAPSULE | Freq: Four times a day (QID) | ORAL | 0 refills | Status: DC

## 2019-10-21 MED ORDER — LIDOCAINE HCL (PF) 1 % IJ SOLN
10.0000 mL | Freq: Once | INTRAMUSCULAR | Status: AC
  Administered 2019-10-21: 10 mL
  Filled 2019-10-21: qty 30

## 2019-10-21 MED ORDER — BACITRACIN ZINC 500 UNIT/GM EX OINT
TOPICAL_OINTMENT | Freq: Two times a day (BID) | CUTANEOUS | Status: DC
  Administered 2019-10-21: 1 via TOPICAL
  Filled 2019-10-21: qty 0.9

## 2019-10-21 NOTE — Discharge Instructions (Addendum)
Take the antibiotics, take, Motrin for pain.  Follow-up with your doctor for wound check and possible referral

## 2019-10-21 NOTE — ED Provider Notes (Signed)
[facility]-EMERGENCY DEPT Provider Note   CSN: [id number] Arrival date & time: 10/21/19  1039     History Chief Complaint  Patient presents with  . Toe Pain    Sherry Hayes is a 34 y.o. female.   Toe Pain This is a new problem. The current episode started 2 days ago. The problem occurs constantly. The problem has been gradually worsening. Pertinent negatives include no chest pain, no abdominal pain, no headaches and no shortness of breath. The symptoms are aggravated by walking. The symptoms are relieved by rest. She has tried nothing for the symptoms. The treatment provided no relief.       Past Medical History:  Diagnosis Date  . Abnormal Pap smear    ASCUS +HPV  . Anemia   . 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 Chlamydia   . Dysrhythmia    stress induced- none x 1 year/ "skipping"  . GERD (gastroesophageal reflux disease)    diet related  . Gonorrhea   . Hydrosalpinx   . Pelvic pain   . Vaginal Pap smear, abnormal     Patient Active Problem List   Diagnosis Date Noted  . Obesity (BMI 30.0-34.9) 01/28/2017  . Endometrioma 02/07/2012  . 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 CESAREAN SECTION    . MASS EXCISION  03/12/2012   Procedure: EXCISION MASS;  Surgeon: Shelly Rubenstein, MD;  Location: [facility];  Service: General;  Laterality: N/A;  Excision of Abdominal Wall Mass     OB History    Gravida  3   Para  1   Term  0   Preterm  1   AB  2   Living  1     SAB  2   TAB      Ectopic      Multiple      Live Births              History reviewed. No pertinent family history.  Social History   Tobacco Use  . Smoking status: Current Every Day Smoker    Packs/day: 0.25    Types: Cigarettes  . Smokeless tobacco: Never Used  Substance Use Topics  . Alcohol use: Yes    Comment: occ  . Drug use: No    Home  Medications Prior to Admission medications   Medication Sig Start Date End Date Taking? Authorizing Provider  acetaminophen (TYLENOL) 500 MG tablet Take 1,000 mg by mouth every 6 (six) hours as needed for mild pain or moderate pain.    [provider]  cephALEXin (KEFLEX) 500 MG capsule Take 1 capsule (500 mg total) by mouth 4 (four) times daily. 10/21/19   Sabino Donovan, MD  Cholecalciferol (VITAMIN D-3) 1000 units CAPS Take by mouth.    [provider]  predniSONE (DELTASONE) 20 MG tablet Two daily with food 02/01/19   Elvina Sidle, MD  vitamin C (ASCORBIC ACID) 500 MG tablet Take 500 mg by mouth daily.    [provider]    Allergies    Latex  Review of Systems   Review of Systems  Constitutional: Negative for chills and fever.  HENT: Negative for congestion and rhinorrhea.   Respiratory: Negative for cough and shortness of breath.   Cardiovascular: Negative for chest pain and palpitations.  Gastrointestinal: Negative for abdominal pain, diarrhea, nausea and vomiting.  Genitourinary: Negative for difficulty urinating and dysuria.  Musculoskeletal: Positive for arthralgias. Negative for back pain.  Skin: Positive for color change. Negative for rash and wound.  Neurological: Negative for light-headedness and headaches.    Physical Exam Updated Vital Signs BP (!) 142/73 (BP Location: Right Arm)   Pulse 86   Temp 98 F (36.7 C) (Oral)   Resp 16   SpO2 99%   Physical Exam Vitals and nursing note reviewed. Exam conducted with a chaperone present.  Constitutional:      General: She is not in acute distress.    Appearance: Normal appearance.  HENT:     Head: Normocephalic and atraumatic.     Nose: No rhinorrhea.  Eyes:     General:        Right eye: No discharge.        Left eye: No discharge.     Conjunctiva/sclera: Conjunctivae normal.  Cardiovascular:     Rate and Rhythm: Normal rate and regular rhythm.  Pulmonary:     Effort: Pulmonary  effort is normal. No respiratory distress.     Breath sounds: No stridor.  Abdominal:     General: Abdomen is flat. There is no distension.     Palpations: Abdomen is soft.  Musculoskeletal:        General: Tenderness and signs of injury present.     Comments: Swelling about the left great toe tenderness palpation, good capillary refill, decreased range of motion though able to range it both with extension and flexion.  Great toenail with synthetic acrylic plate on top and is loose but still intact purulent drainage no red streaking up the foot or leg  Skin:    General: Skin is warm and dry.  Neurological:     General: No focal deficit present.     Mental Status: She is alert. Mental status is at baseline.     Motor: No weakness.  Psychiatric:        Mood and Affect: Mood normal.        Behavior: Behavior normal.     ED Results / Procedures / Treatments   Labs (all labs ordered are listed, but only abnormal results are displayed) Labs Reviewed - No data to display  EKG None  Radiology DG Foot Complete Left  Result Date: 10/21/2019 CLINICAL DATA:  Left great toe pain after injury. EXAM: LEFT FOOT - COMPLETE 3+ VIEW COMPARISON:  None. FINDINGS: There is no evidence of fracture or dislocation. There is no evidence of arthropathy or other focal bone abnormality. Soft tissues are unremarkable. IMPRESSION: Negative. Electronically Signed   By: Lupita Raider M.D.   On: 10/21/2019 12:55    Procedures .Nail Removal  Date/Time: 10/21/2019 1:11 PM Performed by: Sabino Donovan, MD Authorized by: Sabino Donovan, MD   Consent:    Consent obtained:  Verbal   Consent given by:  Patient   Risks discussed:  Bleeding, incomplete removal, infection, pain and permanent nail deformity   Alternatives discussed:  No treatment Location:    Foot:  L big toe Pre-procedure details:    Skin preparation:  ChloraPrep and alcohol Anesthesia (see MAR for exact dosages):    Anesthesia method:  Nerve  block   Block anesthetic:  Lidocaine 1% w/o epi   Block injection procedure:  Anatomic landmarks identified   Block outcome:  Anesthesia achieved Nail Removal:    Nail removed:  Partial   Nail removed location: distal 80%   Nail bed repaired:  no   Post-procedure details:    Dressing:  Antibiotic ointment and 4x4 sterile gauze   Patient tolerance of procedure:  Tolerated well, no immediate complications   (including critical care time)  Medications Ordered in ED Medications  lidocaine (PF) (XYLOCAINE) 1 % injection 10 mL (has no administration in time range)  bacitracin ointment (has no administration in time range)    ED Course  I have reviewed the triage vital signs and the nursing notes.  Pertinent labs & imaging results that were available during my care of the patient were reviewed by me and considered in my medical decision making (see chart for details).    MDM Rules/Calculators/A&P                          Stubbed toe, will get x-ray, will likely try to remove acrylic nail.  Will require digital block.  No other injury found reported.  Tetanus up-to-date.  X-ray imaging reviewed by myself and radiology shows no fracture or malalignment.  Toenails removed as described above with the nail matrix and nail still intact, no need to place artificial nail, antibiotics given with concerns for mild erythema around the toe possible infection, outpatient follow-up for wound check and routine care recommended   Final Clinical Impression(s) / ED Diagnoses Final diagnoses:  Toe injury, left, initial encounter    Rx / DC Orders ED Discharge Orders         Ordered    cephALEXin (KEFLEX) 500 MG capsule  4 times daily        10/21/19 1309           Sabino Donovan, MD 10/21/19 1313

## 2019-10-21 NOTE — ED Triage Notes (Signed)
Pt reports stubbing big toe at home 2 days ago and has increasing pain since. Pt reports oozing from the toe nail.

## 2019-10-22 ENCOUNTER — Telehealth: Payer: Self-pay | Admitting: *Deleted

## 2019-10-22 NOTE — Telephone Encounter (Signed)
Attempted to call patient ,telephone recordiing says

## 2019-10-22 NOTE — Telephone Encounter (Signed)
Please disregard the message below. Called patient telephone number is not in service                                                     Asti Mackley Sharol Harness                                                        PEC                                                     [phone]

## 2019-10-25 ENCOUNTER — Telehealth: Payer: Self-pay | Admitting: *Deleted

## 2019-10-25 NOTE — Telephone Encounter (Signed)
Transition Care Management Unsuccessful Follow-up Telephone Call  Date of discharge and from where: [facility]; 10/21/19  Attempts:  1st Attempt  Reason for unsuccessful TCM follow-up call:  Unable to leave message. Message states "call can not be completed at this time".  Burnard Bunting, RN, BSN, CCRN [facility] [phone]

## 2019-10-29 ENCOUNTER — Telehealth: Payer: Self-pay | Admitting: *Deleted

## 2019-10-29 NOTE — Telephone Encounter (Signed)
Transition Care Management Unsuccessful Follow-up Telephone Call  Date of discharge and from where: 8//26/21, [facility]  Attempts:  2nd Attempt  Reason for unsuccessful TCM follow-up call:  Unable to reach patient.  Burnard Bunting, RN, BSN, CCRN [facility] [phone]

## 2020-01-07 ENCOUNTER — Ambulatory Visit (HOSPITAL_COMMUNITY)
Admission: EM | Admit: 2020-01-07 | Discharge: 2020-01-07 | Disposition: A | Discharge status: Home / Self Care | Payer: Medicaid Other | Attending: Internal Medicine | Admitting: Internal Medicine

## 2020-01-07 ENCOUNTER — Encounter (HOSPITAL_COMMUNITY): Payer: Self-pay | Admitting: Emergency Medicine

## 2020-01-07 ENCOUNTER — Other Ambulatory Visit: Payer: Self-pay

## 2020-01-07 DIAGNOSIS — Z3202 Encounter for pregnancy test, result negative: Secondary | ICD-10-CM | POA: Diagnosis not present

## 2020-01-07 DIAGNOSIS — Z202 Contact with and (suspected) exposure to infections with a predominantly sexual mode of transmission: Secondary | ICD-10-CM | POA: Insufficient documentation

## 2020-01-07 LAB — POCT URINALYSIS DIPSTICK, ED / UC
Bilirubin Urine: NEGATIVE
Glucose, UA: NEGATIVE mg/dL
Hgb urine dipstick: NEGATIVE
Ketones, ur: NEGATIVE mg/dL
Leukocytes,Ua: NEGATIVE
Nitrite: NEGATIVE
Protein, ur: NEGATIVE mg/dL
Specific Gravity, Urine: >=1.03 (ref 1.005–1.030)
Urobilinogen, UA: 0.2 mg/dL (ref 0.0–1.0)
pH: 5.5 (ref 5.0–8.0)

## 2020-01-07 LAB — POC URINE PREG, ED: Preg Test, Ur: NEGATIVE

## 2020-01-07 MED ORDER — LIDOCAINE HCL (PF) 1 % IJ SOLN
INTRAMUSCULAR | Status: AC
  Filled 2020-01-07: qty 2

## 2020-01-07 MED ORDER — CEFTRIAXONE SODIUM 500 MG IJ SOLR
500.0000 mg | Freq: Once | INTRAMUSCULAR | Status: AC
  Administered 2020-01-07: 500 mg via INTRAMUSCULAR

## 2020-01-07 MED ORDER — CEFTRIAXONE SODIUM 500 MG IJ SOLR
INTRAMUSCULAR | Status: AC
  Filled 2020-01-07: qty 500

## 2020-01-07 MED ORDER — DOXYCYCLINE HYCLATE 100 MG PO CAPS: 100.0000 mg | ORAL_CAPSULE | Freq: Two times a day (BID) | ORAL | 0 refills | Status: AC

## 2020-01-07 NOTE — ED Triage Notes (Addendum)
Patient was potentially exposed to STD.   Patient wants a pregnancy test.  Patient denies any symptoms.   Patient has no complaints today.

## 2020-01-07 NOTE — ED Provider Notes (Signed)
[facility]    CSN: [id number] Arrival date & time: 01/07/20  1600      History   Chief Complaint Chief Complaint  Patient presents with  . Exposure to STD    HPI Sherry Hayes is a 34 y.o. female.   HPI   Patient presents today for evaluation of possible exposure to STD.  Patient reports her partner was notified that not a partner that he had been sexually active with had tested positive for chlamydia.  She is asymptomatic.  She is concerned for pregnancy and request a pregnancy test.  However she reports since being here in the clinic her menstrual cycle has started.  She denies any other symptoms or any changes to vaginal discharge.  Past Medical History:  Diagnosis Date  . Abnormal Pap smear    ASCUS +HPV  . Anemia   . 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 Chlamydia   . Dysrhythmia    stress induced- none x 1 year/ "skipping"  . GERD (gastroesophageal reflux disease)    diet related  . Gonorrhea   . Hydrosalpinx   . Pelvic pain   . Vaginal Pap smear, abnormal     Patient Active Problem List   Diagnosis Date Noted  . Obesity (BMI 30.0-34.9) 01/28/2017  . Endometrioma 02/07/2012  . 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 CESAREAN SECTION    . MASS EXCISION  03/12/2012   Procedure: EXCISION MASS;  Surgeon: Shelly Rubenstein, MD;  Location: [facility];  Service: General;  Laterality: N/A;  Excision of Abdominal Wall Mass    OB History    Gravida  3   Para  1   Term  0   Preterm  1   AB  2   Living  1     SAB  2   TAB      Ectopic      Multiple      Live Births               Home Medications    Prior to Admission medications   Medication Sig Start Date End Date Taking? Authorizing Provider  acetaminophen (TYLENOL) 500 MG tablet Take 1,000 mg by mouth every 6 (six) hours as needed for mild pain or moderate pain.    [provider]  cephALEXin (KEFLEX) 500 MG capsule Take 1 capsule (500 mg total) by mouth 4 (four) times daily. 10/21/19   Sabino Donovan, MD  Cholecalciferol (VITAMIN D-3) 1000 units CAPS Take by mouth.    [provider]  predniSONE (DELTASONE) 20 MG tablet Two daily with food 02/01/19   Elvina Sidle, MD  vitamin C (ASCORBIC ACID) 500 MG tablet Take 500 mg by mouth daily.    [provider]    Family History History reviewed. No pertinent family history.  Social History Social History   Tobacco Use  . Smoking status: Current Every Day Smoker    Packs/day: 0.25    Types: Cigarettes  . Smokeless tobacco: Never Used  Substance Use Topics  . Alcohol use: Yes    Comment: occ  . Drug use: No     Allergies   Latex   Review of Systems Review of Systems Pertinent negatives listed in HPI  Physical Exam Triage Vital Signs ED Triage Vitals  Enc Vitals Group     BP 01/07/20 1708 Marland Kitchen)  162/86     Pulse Rate 01/07/20 1708 (!) 118     Resp 01/07/20 1708 16     Temp 01/07/20 1708 99.7 F (37.6 C)     Temp Source 01/07/20 1708 Oral     SpO2 01/07/20 1708 100 %     Weight --      Height --      Head Circumference --      Peak Flow --      Pain Score 01/07/20 1706 0     Pain Loc --      Pain Edu? --      Excl. in GC? --    No data found.  Updated Vital Signs BP (!) 162/86 (BP Location: Left Arm)   Pulse (!) 118   Temp 99.7 F (37.6 C) (Oral)   Resp 16   LMP 12/13/2019   SpO2 100%   Visual Acuity Right Eye Distance:   Left Eye Distance:   Bilateral Distance:    Right Eye Near:   Left Eye Near:    Bilateral Near:     Physical Exam General appearance: alert, well developed, well nourished, cooperative aHead: Normocephalic, without obvious abnormality, atraumatic Respiratory: Respirations even and unlabored, normal respiratory rate Heart: rate and rhythm normal.  Extremities: No gross deformities Skin: Skin color, texture, turgor normal. No  rashes seen  Psych: Appropriate mood and affect. Vaginal cytology self-collected  UC Treatments / Results  Labs (all labs ordered are listed, but only abnormal results are displayed) Labs Reviewed  POC URINE PREG, ED  POCT URINALYSIS DIPSTICK, ED / UC  CERVICOVAGINAL ANCILLARY ONLY    EKG   Radiology No results found.  Procedures Procedures (including critical care time)  Medications Ordered in UC Medications - No data to display  Initial Impression / Assessment and Plan / UC Course  I have reviewed the triage vital signs and the nursing notes.  Pertinent labs & imaging results that were available during my care of the patient were reviewed by me and considered in my medical decision making (see chart for details).    Urine pregnancy negative.  Rocephin 500 mg IM administered and doxycycline 100 mg twice daily x7 days prescribed to cover for both gonorrhea and chlamydia. Cytology sent, patient will be contacted with any positive results that require additional treatment. Patient to refrain from sexual activity for the next 7 days. Advised patient to notify any sexual partners if results are positive. Return precautions given.    Final Clinical Impressions(s) / UC Diagnoses   Final diagnoses:  Possible exposure to STD   Discharge Instructions   None    ED Prescriptions    Medication Sig Dispense Auth. Provider   doxycycline (VIBRAMYCIN) 100 MG capsule Take 1 capsule (100 mg total) by mouth 2 (two) times daily for 7 days. 14 capsule Bing Neighbors, FNP     PDMP not reviewed this encounter.   Bing Neighbors, FNP 01/07/20 [phone]

## 2020-01-10 ENCOUNTER — Telehealth (HOSPITAL_COMMUNITY): Payer: Self-pay | Admitting: Emergency Medicine

## 2020-01-10 LAB — CERVICOVAGINAL ANCILLARY ONLY
Bacterial Vaginitis (gardnerella): POSITIVE — AB
Candida Glabrata: NEGATIVE
Candida Vaginitis: NEGATIVE
Chlamydia: POSITIVE — AB
Comment: NEGATIVE
Comment: NEGATIVE
Comment: NEGATIVE
Comment: NEGATIVE
Comment: NEGATIVE
Comment: NORMAL
Neisseria Gonorrhea: NEGATIVE
Trichomonas: NEGATIVE

## 2020-01-11 MED ORDER — METRONIDAZOLE 500 MG PO TABS: 500.0000 mg | ORAL_TABLET | Freq: Two times a day (BID) | ORAL | 0 refills | Status: DC

## 2020-05-21 ENCOUNTER — Encounter (HOSPITAL_COMMUNITY): Payer: Self-pay

## 2020-05-21 ENCOUNTER — Inpatient Hospital Stay (HOSPITAL_COMMUNITY)
Admission: EM | Admit: 2020-05-21 | Discharge: 2020-05-24 | DRG: 759 | Disposition: A | Discharge status: Home / Self Care | Payer: Medicaid Other | Attending: Obstetrics & Gynecology | Admitting: Obstetrics & Gynecology

## 2020-05-21 ENCOUNTER — Inpatient Hospital Stay (EMERGENCY_DEPARTMENT_HOSPITAL)
Admission: AD | Admit: 2020-05-21 | Discharge: 2020-05-21 | Disposition: A | Discharge status: Home / Self Care | Payer: Medicaid Other | Source: Home / Self Care | Attending: Obstetrics & Gynecology | Admitting: Obstetrics & Gynecology

## 2020-05-21 ENCOUNTER — Other Ambulatory Visit: Payer: Self-pay

## 2020-05-21 ENCOUNTER — Emergency Department (HOSPITAL_COMMUNITY)
Admission: EM | Admit: 2020-05-21 | Discharge: 2020-05-21 | Disposition: A | Discharge status: Home / Self Care | Payer: Medicaid Other | Attending: Emergency Medicine | Admitting: Emergency Medicine

## 2020-05-21 DIAGNOSIS — F17210 Nicotine dependence, cigarettes, uncomplicated: Secondary | ICD-10-CM | POA: Diagnosis present

## 2020-05-21 DIAGNOSIS — Z5321 Procedure and treatment not carried out due to patient leaving prior to being seen by health care provider: Secondary | ICD-10-CM | POA: Insufficient documentation

## 2020-05-21 DIAGNOSIS — N730 Acute parametritis and pelvic cellulitis: Secondary | ICD-10-CM

## 2020-05-21 DIAGNOSIS — R319 Hematuria, unspecified: Secondary | ICD-10-CM | POA: Diagnosis present

## 2020-05-21 DIAGNOSIS — N739 Female pelvic inflammatory disease, unspecified: Secondary | ICD-10-CM

## 2020-05-21 DIAGNOSIS — E669 Obesity, unspecified: Secondary | ICD-10-CM | POA: Diagnosis present

## 2020-05-21 DIAGNOSIS — K5900 Constipation, unspecified: Secondary | ICD-10-CM | POA: Insufficient documentation

## 2020-05-21 DIAGNOSIS — K219 Gastro-esophageal reflux disease without esophagitis: Secondary | ICD-10-CM | POA: Diagnosis present

## 2020-05-21 DIAGNOSIS — Z3202 Encounter for pregnancy test, result negative: Secondary | ICD-10-CM | POA: Insufficient documentation

## 2020-05-21 DIAGNOSIS — Z6830 Body mass index (BMI) 30.0-30.9, adult: Secondary | ICD-10-CM

## 2020-05-21 DIAGNOSIS — N7093 Salpingitis and oophoritis, unspecified: Principal | ICD-10-CM | POA: Diagnosis present

## 2020-05-21 DIAGNOSIS — Z20822 Contact with and (suspected) exposure to covid-19: Secondary | ICD-10-CM | POA: Diagnosis present

## 2020-05-21 DIAGNOSIS — Z91040 Latex allergy status: Secondary | ICD-10-CM

## 2020-05-21 DIAGNOSIS — Z7952 Long term (current) use of systemic steroids: Secondary | ICD-10-CM

## 2020-05-21 DIAGNOSIS — Z79899 Other long term (current) drug therapy: Secondary | ICD-10-CM

## 2020-05-21 LAB — HCG, QUANTITATIVE, PREGNANCY: hCG, Beta Chain, Quant, S: <1 m[IU]/mL (ref <5)

## 2020-05-21 LAB — POCT PREGNANCY, URINE: Preg Test, Ur: NEGATIVE

## 2020-05-21 NOTE — MAU Note (Signed)
Pt reports to mau with c/o constipation for the past few days.  Pt states last BM was Friday.  Pt reports pos HPT yesterday, but states she has not missed a period yet. Denies vag bleeding or dc.

## 2020-05-21 NOTE — MAU Provider Note (Addendum)
History     CSN: [id number]  Arrival date and time: 05/21/20 1030   Event Date/Time   First Provider Initiated Contact with Patient 05/21/20 1105      Chief Complaint  Patient presents with  . Constipation  . Possible Pregnancy   35 y.o. [id number] at unknown gestation presenting with constipation and possible pregnancy. She reports BM 2 days ago but began feeling rectal pain yesterday and had to manually remove stool. She reports recently eating steak which made her constipated in the past. Reports +HPT yesterday although she has not missed her period. Denies abdominal pain. No other complaints.   OB History    Gravida  3   Para  1   Term  0   Preterm  1   AB  2   Living  1     SAB  2   IAB      Ectopic      Multiple      Live Births              Past Medical History:  Diagnosis Date  . Abnormal Pap smear    ASCUS +HPV  . Anemia   . 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 Chlamydia   . Dysrhythmia    stress induced- none x 1 year/ "skipping"  . GERD (gastroesophageal reflux disease)    diet related  . Gonorrhea   . Hydrosalpinx   . Pelvic pain   . Vaginal Pap smear, abnormal     Past Surgical History:  Procedure Laterality Date  . CESAREAN SECTION    . MASS EXCISION  03/12/2012   Procedure: EXCISION MASS;  Surgeon: Shelly Rubenstein, MD;  Location: [facility];  Service: General;  Laterality: N/A;  Excision of Abdominal Wall Mass    No family history on file.  Social History   Tobacco Use  . Smoking status: Current Every Day Smoker    Packs/day: 0.25    Types: Cigarettes  . Smokeless tobacco: Never Used  Substance Use Topics  . Alcohol use: Yes    Comment: occ  . Drug use: No    Allergies:  Allergies  Allergen Reactions  . Latex     No medications prior to admission.    Review of Systems  Gastrointestinal: Positive for constipation. Negative for abdominal pain.  Genitourinary: Negative for vaginal  bleeding.   Physical Exam   Blood pressure (!) 142/79, pulse (!) 117, temperature 98.2 F (36.8 C), temperature source Oral, resp. rate 17, last menstrual period 04/29/2020, SpO2 99 %, unknown if currently breastfeeding.  Physical Exam Vitals and nursing note reviewed.  Constitutional:      General: She is not in acute distress.    Appearance: Normal appearance.  HENT:     Head: Normocephalic and atraumatic.  Pulmonary:     Effort: Pulmonary effort is normal. No respiratory distress.  Musculoskeletal:        General: Normal range of motion.  Neurological:     General: No focal deficit present.     Mental Status: She is alert and oriented to person, place, and time.  Psychiatric:        Mood and Affect: Mood normal.        Behavior: Behavior normal.    Results for orders placed or performed during the hospital encounter of 05/21/20 (from the past 24 hour(s))  Pregnancy, urine POC     Status: None   Collection Time:  05/21/20 10:53 AM  Result Value Ref Range   Preg Test, Ur NEGATIVE NEGATIVE  hCG, quantitative, pregnancy     Status: None   Collection Time: 05/21/20 11:13 AM  Result Value Ref Range   hCG, Beta Chain, Quant, S <1 <5 mIU/mL   MAU Course  Procedures  MDM Labs ordered and reviewed. No signs of pregnancy. Recommend Fleet enema, Miralax, water, and dietary fiber. Will draw quant HCG and call pt with results. Stable for discharge home. 1335: Pt notified by phone of neg qhcg. All questions answered.  Assessment and Plan   1. Negative pregnancy test   2. Constipation, unspecified constipation type    Discharge home Follow up with PCP as needed Return to MAU for OB emergencies  Allergies as of 05/21/2020      Reactions   Latex       Medication List    STOP taking these medications   metroNIDAZOLE 500 MG tablet Commonly known as: FLAGYL     TAKE these medications   acetaminophen 500 MG tablet Commonly known as: TYLENOL Take 1,000 mg by mouth every 6  (six) hours as needed for mild pain or moderate pain.   predniSONE 20 MG tablet Commonly known as: DELTASONE Two daily with food   vitamin C 500 MG tablet Commonly known as: ASCORBIC ACID Take 500 mg by mouth daily.   Vitamin D-3 25 MCG (1000 UT) Caps Take by mouth.       Donette Larry, CNM 05/21/2020, 1:28 PM

## 2020-05-21 NOTE — ED Triage Notes (Signed)
Pt came in with c/o fall times two days ago. She is c/o pain on her R side. She states that she fell down the stairs. She states that she started peeing blood yesterday. Pt does not have urinary symptoms associated.

## 2020-05-21 NOTE — Discharge Instructions (Signed)

## 2020-05-21 NOTE — ED Notes (Signed)
patient left on own accord

## 2020-05-22 ENCOUNTER — Emergency Department (HOSPITAL_COMMUNITY): Payer: Medicaid Other

## 2020-05-22 ENCOUNTER — Encounter (HOSPITAL_COMMUNITY): Payer: Self-pay

## 2020-05-22 DIAGNOSIS — E669 Obesity, unspecified: Secondary | ICD-10-CM | POA: Diagnosis not present

## 2020-05-22 DIAGNOSIS — Z91040 Latex allergy status: Secondary | ICD-10-CM | POA: Diagnosis not present

## 2020-05-22 DIAGNOSIS — F17210 Nicotine dependence, cigarettes, uncomplicated: Secondary | ICD-10-CM | POA: Diagnosis not present

## 2020-05-22 DIAGNOSIS — Z6830 Body mass index (BMI) 30.0-30.9, adult: Secondary | ICD-10-CM | POA: Diagnosis not present

## 2020-05-22 DIAGNOSIS — K5900 Constipation, unspecified: Secondary | ICD-10-CM | POA: Diagnosis not present

## 2020-05-22 DIAGNOSIS — Z20822 Contact with and (suspected) exposure to covid-19: Secondary | ICD-10-CM | POA: Diagnosis not present

## 2020-05-22 DIAGNOSIS — Z7952 Long term (current) use of systemic steroids: Secondary | ICD-10-CM | POA: Diagnosis not present

## 2020-05-22 DIAGNOSIS — N739 Female pelvic inflammatory disease, unspecified: Secondary | ICD-10-CM | POA: Diagnosis present

## 2020-05-22 DIAGNOSIS — N7093 Salpingitis and oophoritis, unspecified: Secondary | ICD-10-CM | POA: Diagnosis present

## 2020-05-22 DIAGNOSIS — R1031 Right lower quadrant pain: Secondary | ICD-10-CM | POA: Diagnosis not present

## 2020-05-22 DIAGNOSIS — K219 Gastro-esophageal reflux disease without esophagitis: Secondary | ICD-10-CM | POA: Diagnosis not present

## 2020-05-22 DIAGNOSIS — Z79899 Other long term (current) drug therapy: Secondary | ICD-10-CM | POA: Diagnosis not present

## 2020-05-22 DIAGNOSIS — R319 Hematuria, unspecified: Secondary | ICD-10-CM | POA: Diagnosis not present

## 2020-05-22 LAB — URINALYSIS, ROUTINE W REFLEX MICROSCOPIC
Bacteria, UA: NONE SEEN
Bilirubin Urine: NEGATIVE
Glucose, UA: NEGATIVE mg/dL
Ketones, ur: 20 mg/dL — AB
Leukocytes,Ua: NEGATIVE
Nitrite: NEGATIVE
Protein, ur: 30 mg/dL — AB
RBC / HPF: >50 RBC/hpf — ABNORMAL HIGH (ref 0–5)
Specific Gravity, Urine: 1.041 — ABNORMAL HIGH (ref 1.005–1.030)
pH: 5 (ref 5.0–8.0)

## 2020-05-22 LAB — BASIC METABOLIC PANEL
Anion gap: 9 (ref 5–15)
BUN: 10 mg/dL (ref 6–20)
CO2: 23 mmol/L (ref 22–32)
Calcium: 9.2 mg/dL (ref 8.9–10.3)
Chloride: 101 mmol/L (ref 98–111)
Creatinine, Ser: 0.71 mg/dL (ref 0.44–1.00)
GFR, Estimated: >60 mL/min (ref >60)
Glucose, Bld: 136 mg/dL — ABNORMAL HIGH (ref 70–99)
Potassium: 3.6 mmol/L (ref 3.5–5.1)
Sodium: 133 mmol/L — ABNORMAL LOW (ref 135–145)

## 2020-05-22 LAB — CBC WITH DIFFERENTIAL/PLATELET
Abs Immature Granulocytes: 0.1 10*3/uL — ABNORMAL HIGH (ref 0.00–0.07)
Basophils Absolute: 0 10*3/uL (ref 0.0–0.1)
Basophils Relative: 0 %
Eosinophils Absolute: 0 10*3/uL (ref 0.0–0.5)
Eosinophils Relative: 0 %
HCT: 29.5 % — ABNORMAL LOW (ref 36.0–46.0)
Hemoglobin: 9.3 g/dL — ABNORMAL LOW (ref 12.0–15.0)
Immature Granulocytes: 1 %
Lymphocytes Relative: 5 %
Lymphs Abs: 0.8 10*3/uL (ref 0.7–4.0)
MCH: 25.7 pg — ABNORMAL LOW (ref 26.0–34.0)
MCHC: 31.5 g/dL (ref 30.0–36.0)
MCV: 81.5 fL (ref 80.0–100.0)
Monocytes Absolute: 1.5 10*3/uL — ABNORMAL HIGH (ref 0.1–1.0)
Monocytes Relative: 8 %
Neutro Abs: 16.2 10*3/uL — ABNORMAL HIGH (ref 1.7–7.7)
Neutrophils Relative %: 86 %
Platelets: 352 10*3/uL (ref 150–400)
RBC: 3.62 MIL/uL — ABNORMAL LOW (ref 3.87–5.11)
RDW: 17.6 % — ABNORMAL HIGH (ref 11.5–15.5)
WBC: 18.7 10*3/uL — ABNORMAL HIGH (ref 4.0–10.5)
nRBC: 0 % (ref 0.0–0.2)

## 2020-05-22 LAB — RPR: RPR Ser Ql: NONREACTIVE

## 2020-05-22 LAB — WET PREP, GENITAL
Sperm: NONE SEEN
Trich, Wet Prep: NONE SEEN
Yeast Wet Prep HPF POC: NONE SEEN

## 2020-05-22 LAB — SARS CORONAVIRUS 2 (TAT 6-24 HRS): SARS Coronavirus 2: NEGATIVE

## 2020-05-22 LAB — GC/CHLAMYDIA PROBE AMP (~~LOC~~) NOT AT ARMC
Chlamydia: NEGATIVE
Comment: NEGATIVE
Comment: NORMAL
Neisseria Gonorrhea: NEGATIVE

## 2020-05-22 LAB — HIV ANTIBODY (ROUTINE TESTING W REFLEX): HIV Screen 4th Generation wRfx: NONREACTIVE

## 2020-05-22 MED ORDER — SODIUM CHLORIDE 0.9 % IV SOLN
2.0000 g | Freq: Two times a day (BID) | INTRAVENOUS | Status: DC
  Administered 2020-05-22 – 2020-05-24 (×5): 2 g via INTRAVENOUS
  Filled 2020-05-22 (×8): qty 2

## 2020-05-22 MED ORDER — SODIUM CHLORIDE 0.9 % IV BOLUS
1000.0000 mL | Freq: Once | INTRAVENOUS | Status: AC
  Administered 2020-05-22: 1000 mL via INTRAVENOUS

## 2020-05-22 MED ORDER — ONDANSETRON HCL 4 MG/2ML IJ SOLN: 4.0000 mg | Freq: Four times a day (QID) | INTRAMUSCULAR | Status: DC | PRN

## 2020-05-22 MED ORDER — LACTATED RINGERS IV SOLN: INTRAVENOUS | Status: DC

## 2020-05-22 MED ORDER — SODIUM CHLORIDE 0.9 % IV SOLN
100.0000 mg | Freq: Two times a day (BID) | INTRAVENOUS | Status: DC
  Administered 2020-05-22 – 2020-05-24 (×4): 100 mg via INTRAVENOUS
  Filled 2020-05-22 (×6): qty 100

## 2020-05-22 MED ORDER — MORPHINE SULFATE (PF) 4 MG/ML IV SOLN
6.0000 mg | Freq: Once | INTRAVENOUS | Status: AC
Start: 2020-05-22 — End: 2020-05-22
  Administered 2020-05-22: 6 mg via INTRAVENOUS
  Filled 2020-05-22: qty 2

## 2020-05-22 MED ORDER — OXYCODONE-ACETAMINOPHEN 5-325 MG PO TABS
1.0000 | ORAL_TABLET | Freq: Once | ORAL | Status: AC
  Administered 2020-05-22: 1 via ORAL
  Filled 2020-05-22: qty 1

## 2020-05-22 MED ORDER — IBUPROFEN 600 MG PO TABS
600.0000 mg | ORAL_TABLET | Freq: Four times a day (QID) | ORAL | Status: DC | PRN
  Administered 2020-05-23: 600 mg via ORAL
  Filled 2020-05-22 (×2): qty 1

## 2020-05-22 MED ORDER — DOCUSATE SODIUM 100 MG PO CAPS
100.0000 mg | ORAL_CAPSULE | Freq: Two times a day (BID) | ORAL | Status: DC
  Administered 2020-05-22 – 2020-05-23 (×4): 100 mg via ORAL
  Filled 2020-05-22 (×4): qty 1

## 2020-05-22 MED ORDER — ONDANSETRON HCL 4 MG PO TABS: 4.0000 mg | ORAL_TABLET | Freq: Four times a day (QID) | ORAL | Status: DC | PRN

## 2020-05-22 MED ORDER — METRONIDAZOLE 500 MG PO TABS: 500.0000 mg | ORAL_TABLET | Freq: Two times a day (BID) | ORAL | Status: DC

## 2020-05-22 MED ORDER — FENTANYL CITRATE (PF) 100 MCG/2ML IJ SOLN
50.0000 ug | Freq: Once | INTRAMUSCULAR | Status: AC
  Administered 2020-05-22: 50 ug via INTRAVENOUS
  Filled 2020-05-22: qty 2

## 2020-05-22 MED ORDER — ALUM & MAG HYDROXIDE-SIMETH 200-200-20 MG/5ML PO SUSP
30.0000 mL | ORAL | Status: DC | PRN
  Administered 2020-05-24: 30 mL via ORAL
  Filled 2020-05-22: qty 30

## 2020-05-22 MED ORDER — HYDROMORPHONE HCL 1 MG/ML IJ SOLN
0.5000 mg | Freq: Once | INTRAMUSCULAR | Status: AC
  Administered 2020-05-22: 0.5 mg via INTRAVENOUS
  Filled 2020-05-22: qty 1

## 2020-05-22 MED ORDER — SODIUM CHLORIDE 0.9 % IV SOLN
100.0000 mg | Freq: Once | INTRAVENOUS | Status: AC
  Administered 2020-05-22: 100 mg via INTRAVENOUS
  Filled 2020-05-22: qty 100

## 2020-05-22 MED ORDER — ENOXAPARIN SODIUM 40 MG/0.4ML ~~LOC~~ SOLN
40.0000 mg | SUBCUTANEOUS | Status: DC
  Administered 2020-05-23: 40 mg via SUBCUTANEOUS
  Filled 2020-05-22: qty 0.4

## 2020-05-22 MED ORDER — IOHEXOL 300 MG/ML  SOLN
100.0000 mL | Freq: Once | INTRAMUSCULAR | Status: AC | PRN
  Administered 2020-05-22: 100 mL via INTRAVENOUS

## 2020-05-22 MED ORDER — OXYCODONE-ACETAMINOPHEN 5-325 MG PO TABS
1.0000 | ORAL_TABLET | Freq: Four times a day (QID) | ORAL | Status: DC | PRN
  Administered 2020-05-22 (×2): 2 via ORAL
  Administered 2020-05-23 (×2): 1 via ORAL
  Administered 2020-05-24: 2 via ORAL
  Filled 2020-05-22: qty 1
  Filled 2020-05-22: qty 2
  Filled 2020-05-22: qty 1
  Filled 2020-05-22 (×2): qty 2

## 2020-05-22 MED ORDER — KETOROLAC TROMETHAMINE 30 MG/ML IJ SOLN
30.0000 mg | Freq: Four times a day (QID) | INTRAMUSCULAR | Status: AC
  Administered 2020-05-22 – 2020-05-23 (×4): 30 mg via INTRAVENOUS
  Filled 2020-05-22 (×4): qty 1

## 2020-05-22 MED ORDER — METRONIDAZOLE 500 MG PO TABS
500.0000 mg | ORAL_TABLET | Freq: Two times a day (BID) | ORAL | Status: DC
  Administered 2020-05-22 – 2020-05-23 (×4): 500 mg via ORAL
  Filled 2020-05-22 (×4): qty 1

## 2020-05-22 MED ORDER — ONDANSETRON HCL 4 MG/2ML IJ SOLN
4.0000 mg | Freq: Once | INTRAMUSCULAR | Status: AC
  Administered 2020-05-22: 4 mg via INTRAVENOUS
  Filled 2020-05-22: qty 2

## 2020-05-22 NOTE — ED Notes (Signed)
Called report to 6N. Was told pt will be received in 6N room 28. Called [facility] to arrange transport.

## 2020-05-22 NOTE — ED Provider Notes (Signed)
[facility]-EMERGENCY DEPT Provider Note   CSN: [id number] Arrival date & time: 05/21/20  2316     History Chief Complaint  Patient presents with  . Hematuria    Sherry Hayes is a 35 y.o. female.  Patient to ED with complaint of pain to the left lateral chest and left hip x 2 days since she fell. No SOB, nausea, abdominal pain. She denies head injury of current headache. Today, the left hip pain extended across her abdomen to the right lower side and she started noticing blood in her urine. She took some Tylenol at home but vomited shortly after. No persistent nausea since. No fever, vaginal discharge, vaginal bleeding.   The history is provided by the patient. No language interpreter was used.  Hematuria Associated symptoms include chest pain (left lateral) and abdominal pain. Pertinent negatives include no headaches and no shortness of breath.       Past Medical History:  Diagnosis Date  . Abnormal Pap smear    ASCUS +HPV  . Anemia   . 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 Chlamydia   . Dysrhythmia    stress induced- none x 1 year/ "skipping"  . GERD (gastroesophageal reflux disease)    diet related  . Gonorrhea   . Hydrosalpinx   . Pelvic pain   . Vaginal Pap smear, abnormal     Patient Active Problem List   Diagnosis Date Noted  . Obesity (BMI 30.0-34.9) 01/28/2017  . Endometrioma 02/07/2012  . 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 CESAREAN SECTION    . MASS EXCISION  03/12/2012   Procedure: EXCISION MASS;  Surgeon: Shelly Rubenstein, MD;  Location: [facility];  Service: General;  Laterality: N/A;  Excision of Abdominal Wall Mass     OB History    Gravida  3   Para  1   Term  0   Preterm  1   AB  2   Living  1     SAB  2   IAB      Ectopic      Multiple      Live Births              No family history on  file.  Social History   Tobacco Use  . Smoking status: Current Every Day Smoker    Packs/day: 0.25    Types: Cigarettes  . Smokeless tobacco: Never Used  Substance Use Topics  . Alcohol use: Not Currently    Comment: occ  . Drug use: No    Home Medications Prior to Admission medications   Medication Sig Start Date End Date Taking? Authorizing Provider  acetaminophen (TYLENOL) 500 MG tablet Take 1,000 mg by mouth every 6 (six) hours as needed for mild pain or moderate pain.    [provider]  Cholecalciferol (VITAMIN D-3) 1000 units CAPS Take by mouth.    [provider]  predniSONE (DELTASONE) 20 MG tablet Two daily with food 02/01/19   Elvina Sidle, MD  vitamin C (ASCORBIC ACID) 500 MG tablet Take 500 mg by mouth daily.    [provider]    Allergies    Latex  Review of Systems   Review of Systems  Constitutional: Negative for chills and fever.  Respiratory: Negative.  Negative for shortness of breath.   Cardiovascular: Positive for chest pain (left  lateral).  Gastrointestinal: Positive for abdominal pain and vomiting (See HPI.).  Genitourinary: Positive for hematuria. Negative for flank pain.  Musculoskeletal: Negative.  Negative for back pain.  Skin: Positive for color change (Bruising to left lateral chest wall).  Neurological: Negative.  Negative for weakness and headaches.    Physical Exam Updated Vital Signs BP 126/88 (BP Location: Left Arm)   Pulse (!) 115   Temp (!) 96.6 F (35.9 C) (Axillary)   Resp 15   Ht 5\' 4"  (1.626 m)   Wt 79.4 kg   LMP 04/29/2020   SpO2 100%   BMI 30.04 kg/m   Physical Exam Vitals and nursing note reviewed.  Constitutional:      Appearance: She is well-developed.  Pulmonary:     Effort: Pulmonary effort is normal.  Chest:     Chest wall: Tenderness (Left lateral chest, mild) present.  Abdominal:     General: There is no distension.     Palpations: Abdomen is soft. There is no mass.      Tenderness: There is abdominal tenderness.     Comments: Mild tenderness across lower abdomen, R>L. No upper abdominal TTP.   Genitourinary:    Comments: There is scant cervical bleeding without visualized discharge. The cervix is tender with movement. No significant adnexal tenderness or mass.  Musculoskeletal:        General: Normal range of motion.     Cervical back: Normal range of motion.     Comments: Tender over left iliac crest. No bruising. No hip or midline spinal tenderness of lower back.   Skin:    General: Skin is warm and dry.     Findings: Bruising (Very mild bruising of small area left lateral chest, midaxillary line) present.  Neurological:     Mental Status: She is alert and oriented to person, place, and time.     ED Results / Procedures / Treatments   Labs (all labs ordered are listed, but only abnormal results are displayed) Labs Reviewed  URINALYSIS, ROUTINE W REFLEX MICROSCOPIC - Abnormal; Notable for the following components:      Result Value   Color, Urine AMBER (*)    Specific Gravity, Urine 1.041 (*)    Hgb urine dipstick LARGE (*)    Ketones, ur 20 (*)    Protein, ur 30 (*)    RBC / HPF >50 (*)    All other components within normal limits  BASIC METABOLIC PANEL  CBC WITH DIFFERENTIAL/PLATELET    EKG None  Radiology No results found.  Procedures Procedures CRITICAL CARE Performed by: 06/29/2020   Total critical care time: 50 minutes  Critical care time was exclusive of separately billable procedures and treating other patients.  Critical care was necessary to treat or prevent imminent or life-threatening deterioration.  Critical care was time spent personally by me on the following activities: development of treatment plan with patient and/or surrogate as well as nursing, discussions with consultants, evaluation of patient's response to treatment, examination of patient, obtaining history from patient or surrogate, ordering and  performing treatments and interventions, ordering and review of laboratory studies, ordering and review of radiographic studies, pulse oximetry and re-evaluation of patient's condition.   Medications Ordered in ED Medications  sodium chloride 0.9 % bolus 1,000 mL (1,000 mLs Intravenous New Bag/Given 05/22/20 0020)  ondansetron (ZOFRAN) injection 4 mg (4 mg Intravenous Given 05/22/20 0017)  oxyCODONE-acetaminophen (PERCOCET/ROXICET) 5-325 MG per tablet 1 tablet (1 tablet Oral Given 05/22/20 0017)  ED Course  I have reviewed the triage vital signs and the nursing notes.  Pertinent labs & imaging results that were available during my care of the patient were reviewed by me and considered in my medical decision making (see chart for details).    MDM Rules/Calculators/A&P                          Patient to ED with ss/sxs as per HPI.   Chart reviewed. She was seen earlier today in the MAU for concern for pregnancy. Note from provider reviewed. No complaint of pain or report of fall. She was treated for constipation and released. Negative pregnancy.  The patient appears uncomfortable. No fever, tachycardic on arrival. IV started, labs pending. Pain addressed.   Plain film pelvis is negative for any fractures. Labs are remarkable for leukocytosis of 18. Hgb 9.3, history of mild anemia. There is hematuria without obvious infection. High specific gravity . IVF's ordered. On re-examination, she remains quite tender to the lower abdomen. CT abd/pel ordered for further evaluation.   CT shows PID with bilateral tubo-ovarian abscesses, R>L with associated phlegmon into the RLQ. Cefotetan and Doxycycline ordered. Pelvic exam completed and shows some cervical bleeding and tenderness without significant adnexal tenderness.   Discussed care with Dr. Debroah Loop, OB-GYN, who advises to transport the patient to [facility]. Patient is updated on diagnoses, and need for transfer/admission for further care.  All questions addressed.   Final Clinical Impression(s) / ED Diagnoses Final diagnoses:  None   1. PID 2. Bilateral tubo-ovarian abscesses 3. Hematuria  Rx / DC Orders ED Discharge Orders    None       Elpidio Anis, PA-C 05/22/20 0433    Paula Libra, MD 05/22/20 0530

## 2020-05-22 NOTE — ED Notes (Signed)
Eli Phillips, MD regarding temp of 100.4 and HR of 132. No new orders received. Attempted to call Lillia Abed, RN at [facility] to notify her, Unit secretary states that Lillia Abed is unavailable and will call me back

## 2020-05-22 NOTE — ED Notes (Signed)
Called report Lillia Abed, RN on [facility]

## 2020-05-22 NOTE — H&P (Addendum)
GYN ADMIT Patient name: Sherry Hayes MRN [id number]  Date of birth: April 27, 1985 Chief Complaint:   Hematuria  History of Present Illness:   Sherry Hayes is a 35 y.o. [phone] African American female being admitted due to pelvic inflammatory disease.  Initially symptoms started this past Thurs/Friday thought it was related to constipation, but when pain did not improve after BM and continued to worsen she returned to ED.  This am notes 9/10 pelvic/abdominal pain worse on her right side.  Tried tylenol with no improvement.  No nausea or vomiting, some decreased appetite. Currently on menses- moderate to light bleeding.  Denies abnormal discharge, itching or vaginal irritation.  No fever or chills at home.  No other acute concerns.  Depression screen [facility] 2/9 02/06/2018 12/31/2017 01/28/2017  Decreased Interest 0 0 0  Down, Depressed, Hopeless 0 0 0  PHQ - 2 Score 0 0 0  Altered sleeping 0 0 0  Tired, decreased energy 0 0 0  Change in appetite 0 0 0  Feeling bad or failure about yourself  0 0 0  Trouble concentrating 0 0 0  Moving slowly or fidgety/restless 0 0 0  Suicidal thoughts 0 0 0  PHQ-9 Score 0 0 0    Review of Systems:   Pertinent items are noted in HPI, other systems noted to be negative Denies any headaches, blurred vision, fatigue, shortness of breath, chest pain, denies dysuria or change in frequency.    OB History    Gravida  3   Para  1   Term  0   Preterm  1   AB  2   Living  1     SAB  2   IAB      Ectopic      Multiple      Live Births              MEDICAL/FAMILY/SOCIAL HX: Patient's last menstrual period was 04/29/2020.    Past Medical History:  Diagnosis Date  . Abnormal Pap smear    ASCUS +HPV  . Anemia   . Chlamydia    Gonorrhea    Past Surgical History:  Procedure Laterality Date  . CESAREAN SECTION    . MASS EXCISION  03/12/2012   Procedure: EXCISION MASS;  Surgeon: Shelly Rubenstein, MD;  Location: [facility];   Service: General;  Laterality: N/A;  Excision of Abdominal Wall Mass    History reviewed. No pertinent family history.  Social History:  reports that she has been smoking cigarettes. She has been smoking about 0.25 packs per day. Usually it will take her about 1 1/2 week to go through a pack.  She has never used smokeless tobacco.   ALLERGIES/MEDS:  Allergies:  Allergies  Allergen Reactions  . Latex     Medications Prior to Admission  Medication Sig Dispense Refill Last Dose  . acetaminophen (TYLENOL) 500 MG tablet Take 1,000 mg by mouth every 6 (six) hours as needed for mild pain or moderate pain.   05/21/2020 at Unknown time  . Cholecalciferol (VITAMIN D-3) 1000 units CAPS Take by mouth.   Past Week at Unknown time  . vitamin C (ASCORBIC ACID) 500 MG tablet Take 500 mg by mouth daily.   Past Week at Unknown time  . predniSONE (DELTASONE) 20 MG tablet Two daily with food 10 tablet 0     Physical Assessment:   Vitals:   05/22/20 0730 05/22/20 0745 05/22/20 0821 05/22/20 0842  BP: Marland Kitchen)  150/85  (!) 112/100 104/86  Pulse: (!) 136 (!) 124  (!) 125  Resp: 20     Temp: (!) 100.4 F (38 C)   (!) 100.4 F (38 C)  TempSrc: Oral   Oral  SpO2: 100%   94%  Weight:      Height:      Body mass index is 30.04 kg/m.        Physical Examination:   General appearance - appears uncomfortable  Mental status - alert, oriented to person, place, and time  Psych:  She has a normal mood and affect  Skin - warm and dry, normal color  Chest - effort normal, all lung fields clear to auscultation bilaterally  Heart - +tachycardia noted  Neck:  midline trachea, no thyromegaly or nodules  Abdomen - soft, +RLQ tenderness, non-distended  Pelvic - declined- previously completed in ER.  Pt notes it was very painful and would prefer not to repeat exam  Extremities:  No swelling or varicosities noted  Results for orders placed or performed during the hospital encounter of 05/21/20 (from the past 24  hour(s))  Urinalysis, Routine w reflex microscopic Urine, Clean Catch   Collection Time: 05/21/20 11:43 PM  Result Value Ref Range   Color, Urine AMBER (A) YELLOW   APPearance CLEAR CLEAR   Specific Gravity, Urine 1.041 (H) 1.005 - 1.030   pH 5.0 5.0 - 8.0   Glucose, UA NEGATIVE NEGATIVE mg/dL   Hgb urine dipstick LARGE (A) NEGATIVE   Bilirubin Urine NEGATIVE NEGATIVE   Ketones, ur 20 (A) NEGATIVE mg/dL   Protein, ur 30 (A) NEGATIVE mg/dL   Nitrite NEGATIVE NEGATIVE   Leukocytes,Ua NEGATIVE NEGATIVE   RBC / HPF >50 (H) 0 - 5 RBC/hpf   WBC, UA 11-20 0 - 5 WBC/hpf   Bacteria, UA NONE SEEN NONE SEEN   Squamous Epithelial / LPF 0-5 0 - 5   Mucus PRESENT   Basic metabolic panel   Collection Time: 05/22/20 12:30 AM  Result Value Ref Range   Sodium 133 (L) 135 - 145 mmol/L   Potassium 3.6 3.5 - 5.1 mmol/L   Chloride 101 98 - 111 mmol/L   CO2 23 22 - 32 mmol/L   Glucose, Bld 136 (H) 70 - 99 mg/dL   BUN 10 6 - 20 mg/dL   Creatinine, Ser 7.34 0.44 - 1.00 mg/dL   Calcium 9.2 8.9 - 19.3 mg/dL   GFR, Estimated >79 >02 mL/min   Anion gap 9 5 - 15  CBC with Differential   Collection Time: 05/22/20 12:30 AM  Result Value Ref Range   WBC 18.7 (H) 4.0 - 10.5 K/uL   RBC 3.62 (L) 3.87 - 5.11 MIL/uL   Hemoglobin 9.3 (L) 12.0 - 15.0 g/dL   HCT 40.9 (L) 73.5 - 32.9 %   MCV 81.5 80.0 - 100.0 fL   MCH 25.7 (L) 26.0 - 34.0 pg   MCHC 31.5 30.0 - 36.0 g/dL   RDW 92.4 (H) 26.8 - 34.1 %   Platelets 352 150 - 400 K/uL   nRBC 0.0 0.0 - 0.2 %   Neutrophils Relative % 86 %   Neutro Abs 16.2 (H) 1.7 - 7.7 K/uL   Lymphocytes Relative 5 %   Lymphs Abs 0.8 0.7 - 4.0 K/uL   Monocytes Relative 8 %   Monocytes Absolute 1.5 (H) 0.1 - 1.0 K/uL   Eosinophils Relative 0 %   Eosinophils Absolute 0.0 0.0 - 0.5 K/uL   Basophils Relative  0 %   Basophils Absolute 0.0 0.0 - 0.1 K/uL   Immature Granulocytes 1 %   Abs Immature Granulocytes 0.10 (H) 0.00 - 0.07 K/uL  Wet prep, genital   Collection Time:  05/22/20  5:00 AM   Specimen: PATH Cytology Cervicovaginal Ancillary Only  Result Value Ref Range   Yeast Wet Prep HPF POC NONE SEEN NONE SEEN   Trich, Wet Prep NONE SEEN NONE SEEN   Clue Cells Wet Prep HPF POC PRESENT (A) NONE SEEN   WBC, Wet Prep HPF POC FEW (A) NONE SEEN   Sperm NONE SEEN   Results for orders placed or performed during the hospital encounter of 05/21/20 (from the past 24 hour(s))  Pregnancy, urine POC   Collection Time: 05/21/20 10:53 AM  Result Value Ref Range   Preg Test, Ur NEGATIVE NEGATIVE  hCG, quantitative, pregnancy   Collection Time: 05/21/20 11:13 AM  Result Value Ref Range   hCG, Beta Chain, Quant, S <1 <5 mIU/mL    3/28: Abd/Pelvic CT:  Reproductive: Uterus is well visualized within normal enhancement pattern. Adjacent to the uterus however are bilateral cystic changes in the ovaries with adjacent tubular fluid-filled structures right greater than left consistent with tubo-ovarian abscesses. Significant inflammatory changes noted particularly on the right which envelopment of the distal right ureter causing the more proximal obstructive change as well as some inflammatory change of surrounding the distal small bowel. There is a focal 6.2 x 3.3 cm hyperdense area adjacent to this best seen on image number 54 of series 3 and image number 50 of series 6 consistent with phlegmonous change. This is new from the prior examination.   IMPRESSION: Changes consistent with pelvic inflammatory disease and bilateral tubo-ovarian abscesses right greater than left. Some associated phlegmon in the right lower quadrant is noted as well.  Normal-appearing appendix.  Dilatation of the right renal collecting system and right ureter secondary to distal inflammatory change related to the right ovary. This may correspond to the given clinical history of hematuria.   Assessment & Plan:  1) PID -IV Cefotetan/Doxy -Added Metronidazole due to findings suggestive of BV on  wet prep - pain management with IV Toradol, oral percocet and will transition to oral Ibuprofen -LR @ 125cc/hr -GC/C culture pending, UA culture pending -zofran prn  DISPO: Plan to continue with IV antibiotic treatment until 24-48hr of improvement.  Will then transition to outpatient management  Myna Hidalgo, DO Attending Obstetrician & Gynecologist, [facility] for [facility], [facility]

## 2020-05-22 NOTE — ED Notes (Signed)
Attempted to call [facility], will re-attempt.

## 2020-05-22 NOTE — Progress Notes (Signed)
   05/22/20 0842  Assess: MEWS Score  Temp (!) 100.4 F (38 C)  BP 104/86  Pulse Rate (!) 125  SpO2 94 %  Assess: MEWS Score  MEWS Temp 0  MEWS Systolic 0  MEWS Pulse 2  MEWS RR 0  MEWS LOC 0  MEWS Score 2  MEWS Score Color Yellow  Assess: if the MEWS score is Yellow or Red  Were vital signs taken at a resting state? Yes  Early Detection of Sepsis Score *See Row Information* High  MEWS guidelines implemented *See Row Information* Yes  Treat  MEWS Interventions Escalated (See documentation below)  Take Vital Signs  Increase Vital Sign Frequency  Yellow: Q 2hr X 2 then Q 4hr X 2, if remains yellow, continue Q 4hrs  Escalate  MEWS: Escalate Yellow: discuss with charge nurse/RN and consider discussing with provider and RRT  Notify: Charge Nurse/RN  Name of Charge Nurse/RN Notified Park Meo, RN  Date Charge Nurse/RN Notified 05/22/20  Time Charge Nurse/RN Notified 0845

## 2020-05-22 NOTE — Progress Notes (Signed)
Report received from Tobi Bastos, [place].  After receiving pt's vital signs and seeing she is in the yellow MEWS, this RN stated she did not feel comfortable taking this patient until the MD was aware of her current status.  Tobi Bastos, RN stated she would call the doctor and call this RN back.    Hector Shade [place]

## 2020-05-22 NOTE — ED Notes (Signed)
Called [facility] [place], floor requested 30 minutes to get ready for patient.

## 2020-05-22 NOTE — Plan of Care (Signed)
  Problem: Education: Goal: Knowledge of General Education information will improve Description Including pain rating scale, medication(s)/side effects and non-pharmacologic comfort measures Outcome: Progressing   

## 2020-05-23 DIAGNOSIS — E669 Obesity, unspecified: Secondary | ICD-10-CM | POA: Diagnosis present

## 2020-05-23 DIAGNOSIS — K5900 Constipation, unspecified: Secondary | ICD-10-CM | POA: Diagnosis present

## 2020-05-23 DIAGNOSIS — K219 Gastro-esophageal reflux disease without esophagitis: Secondary | ICD-10-CM | POA: Diagnosis present

## 2020-05-23 DIAGNOSIS — N739 Female pelvic inflammatory disease, unspecified: Secondary | ICD-10-CM | POA: Diagnosis present

## 2020-05-23 DIAGNOSIS — Z20822 Contact with and (suspected) exposure to covid-19: Secondary | ICD-10-CM | POA: Diagnosis present

## 2020-05-23 DIAGNOSIS — Z91040 Latex allergy status: Secondary | ICD-10-CM | POA: Diagnosis not present

## 2020-05-23 DIAGNOSIS — Z79899 Other long term (current) drug therapy: Secondary | ICD-10-CM | POA: Diagnosis not present

## 2020-05-23 DIAGNOSIS — N7093 Salpingitis and oophoritis, unspecified: Secondary | ICD-10-CM | POA: Diagnosis present

## 2020-05-23 DIAGNOSIS — R319 Hematuria, unspecified: Secondary | ICD-10-CM | POA: Diagnosis not present

## 2020-05-23 DIAGNOSIS — R1031 Right lower quadrant pain: Secondary | ICD-10-CM | POA: Diagnosis not present

## 2020-05-23 DIAGNOSIS — Z6830 Body mass index (BMI) 30.0-30.9, adult: Secondary | ICD-10-CM | POA: Diagnosis not present

## 2020-05-23 DIAGNOSIS — Z7952 Long term (current) use of systemic steroids: Secondary | ICD-10-CM | POA: Diagnosis not present

## 2020-05-23 DIAGNOSIS — F17210 Nicotine dependence, cigarettes, uncomplicated: Secondary | ICD-10-CM | POA: Diagnosis present

## 2020-05-23 LAB — URINE CULTURE: Culture: NO GROWTH

## 2020-05-23 LAB — CBC
HCT: 27.4 % — ABNORMAL LOW (ref 36.0–46.0)
Hemoglobin: 8.9 g/dL — ABNORMAL LOW (ref 12.0–15.0)
MCH: 26.3 pg (ref 26.0–34.0)
MCHC: 32.5 g/dL (ref 30.0–36.0)
MCV: 80.8 fL (ref 80.0–100.0)
Platelets: 282 10*3/uL (ref 150–400)
RBC: 3.39 MIL/uL — ABNORMAL LOW (ref 3.87–5.11)
RDW: 18.1 % — ABNORMAL HIGH (ref 11.5–15.5)
WBC: 21.6 10*3/uL — ABNORMAL HIGH (ref 4.0–10.5)
nRBC: 0 % (ref 0.0–0.2)

## 2020-05-23 LAB — CREATININE, SERUM
Creatinine, Ser: 0.88 mg/dL (ref 0.44–1.00)
GFR, Estimated: >60 mL/min (ref >60)

## 2020-05-23 MED ORDER — DIPHENHYDRAMINE HCL 25 MG PO CAPS
25.0000 mg | ORAL_CAPSULE | Freq: Four times a day (QID) | ORAL | Status: DC | PRN
  Administered 2020-05-23: 25 mg via ORAL
  Filled 2020-05-23 (×2): qty 1

## 2020-05-23 NOTE — Progress Notes (Signed)
Sherry Hayes is a 35 y.o. female patient admitted due to PID/TOA. Today she notes that she is feeling much better compared to yesterday.  Notes minimal discomfort- 3/10.  Pt able to ambulate and shower without difficulty.  Tolerating general diet.  Reports no acute issues overnight.   Current Facility-Administered Medications  Medication Dose Route Frequency Provider Last Rate Last Admin  . alum & mag hydroxide-simeth (MAALOX/MYLANTA) 200-200-20 MG/5ML suspension 30 mL  30 mL Oral Q4H PRN Myna Hidalgo, DO      . cefoTEtan (CEFOTAN) 2 g in sodium chloride 0.9 % 100 mL IVPB  2 g Intravenous Q12H Upstill, Shari, PA-C 200 mL/hr at 05/23/20 0514 2 g at 05/23/20 0514  . docusate sodium (COLACE) capsule 100 mg  100 mg Oral BID Myna Hidalgo, DO   100 mg at 05/23/20 0946  . doxycycline (VIBRAMYCIN) 100 mg in sodium chloride 0.9 % 250 mL IVPB  100 mg Intravenous Q12H Myna Hidalgo, DO 125 mL/hr at 05/23/20 0606 100 mg at 05/23/20 0606  . enoxaparin (LOVENOX) injection 40 mg  40 mg Subcutaneous Q24H Myna Hidalgo, DO   40 mg at 05/23/20 0946  . ibuprofen (ADVIL) tablet 600 mg  600 mg Oral Q6H PRN Myna Hidalgo, DO   600 mg at 05/23/20 1222  . metroNIDAZOLE (FLAGYL) tablet 500 mg  500 mg Oral Q12H Shauntee Karp, DO   500 mg at 05/23/20 0946  . ondansetron (ZOFRAN) tablet 4 mg  4 mg Oral Q6H PRN Myna Hidalgo, DO       Or  . ondansetron (ZOFRAN) injection 4 mg  4 mg Intravenous Q6H PRN Myna Hidalgo, DO      . oxyCODONE-acetaminophen (PERCOCET/ROXICET) 5-325 MG per tablet 1-2 tablet  1-2 tablet Oral Q6H PRN Myna Hidalgo, DO   2 tablet at 05/22/20 2132   Allergies  Allergen Reactions  . Latex    O: BP 121/68 (BP Location: Left Arm)   Pulse 98   Temp 97.7 F (36.5 C) (Oral)   Resp 18   Ht 5\' 4"  (1.626 m)   Wt 79.4 kg   LMP 04/29/2020   SpO2 100%   BMI 30.04 kg/m   Review of Systems  Constitutional: Negative.   HENT: Negative.   Eyes: Negative.   Respiratory: Negative.    Cardiovascular: Negative.   Gastrointestinal: Positive for constipation.  Endocrine: Negative.   Genitourinary: Negative.   Musculoskeletal: Negative.   Neurological: Negative.   Psychiatric/Behavioral: Negative.     Physical Exam Constitutional:      Appearance: Normal appearance.  Cardiovascular:     Rate and Rhythm: Normal rate and regular rhythm.  Pulmonary:     Effort: Pulmonary effort is normal. No respiratory distress.     Breath sounds: Normal breath sounds.  Abdominal:     General: Bowel sounds are normal. There is no distension.     Tenderness: There is no abdominal tenderness. There is no guarding.  Musculoskeletal:     Cervical back: Normal range of motion.  Skin:    General: Skin is warm and dry.  Neurological:     Mental Status: She is alert and oriented to person, place, and time.  Psychiatric:        Mood and Affect: Mood normal.        Behavior: Behavior normal.   34yo female admitted for PID/TOA -pain significantly improved- will transition to oral ibuprofen/tylenol today -GC/C negative, wet prep suggestive of BV -continue IV antibiotics- if remains afebrile until tomorrow- will  transition to oral with plan for discharge home -leukocytosis noted though clinically significant improvement appreciated -tolerating gen diet without problems   Myna Hidalgo, DO Attending Obstetrician & Gynecologist, [facility] [employer], [facility]

## 2020-05-23 NOTE — Discharge Instructions (Signed)
Pelvic Inflammatory Disease  Pelvic inflammatory disease (PID) is an infection in some or all of the female reproductive organs. PID can be in the womb (uterus), ovaries, fallopian tubes, or nearby tissues that are inside the lower belly area (pelvis). PID can lead to problems if it is not treated. What are the causes?  Germs (bacteria) that are spread during sex. This is the most common cause.  Germs in the vagina that are not spread during sex.  Germs that travel up from the vagina or cervix to the reproductive organs after: ? The birth of a baby. ? A miscarriage. ? An abortion. ? Pelvic surgery. ? Insertion of an intrauterine device (IUD). ? A sexual assault. What increases the risk?  Being younger than 33 years old.  Having sex at a young age.  Having a history of STI (sexually transmitted infection) or PID.  Not using barrier birth control, such as condoms.  Having a lot of sex partners.  Having sex with someone who has symptoms of an STI.  Using a douche.  Having an IUD put in place. What are the signs or symptoms?  Pain in the belly area.  Fever.  Chills.  Discharge from the vagina that is not normal.  Bleeding from the womb that is not normal.  Pain soon after the end of a menstrual period.  Pain when you pee (urinate).  Pain with sex.  Feeling sick to your stomach (nauseous) or throwing up (vomiting). How is this treated?  Antibiotic medicines. In very bad cases, these may be given through an IV tube.  Surgery. This is rare.  Efforts to stop the spread of the infection. Sex partners may need to be treated. It may take weeks until you feel all better. Your doctor may test you for infection again after you finish treatment. You should also be checked for HIV (human immunodeficiency virus). Follow these instructions at home:  Take over-the-counter and prescription medicines only as told by your doctor.  If you were prescribed an antibiotic  medicine, take it as told by your doctor. Do not stop taking it even if you start to feel better.  Do not have sex until treatment is done or as told by your doctor.  Tell your sex partner if you have PID. Your partner may need to be treated.  Keep all follow-up visits as told by your doctor. This is important. Contact a doctor if:  You have more fluid or fluid that is not normal coming from your vagina.  Your pain does not improve.  You throw up.  You have a fever.  You cannot take your medicines.  Your partner has an STI.  You have pain when you pee. Get help right away if:  You have more pain in the belly area.  You have chills.  You are not better in 72 hours with treatment. Summary  Pelvic inflammatory disease (PID) is caused by an infection in some or all of the female reproductive organs.  PID is a serious infection.  This infection is most often treated with antibiotics.  Do not have sex until treatment is done or as told by your doctor. This information is not intended to replace advice given to you by your health care provider. Make sure you discuss any questions you have with your health care provider. Document Revised: 10/30/2017 Document Reviewed: 11/05/2017 Elsevier Patient Education  2021 [employer]  

## 2020-05-24 ENCOUNTER — Other Ambulatory Visit: Payer: Self-pay | Admitting: Obstetrics & Gynecology

## 2020-05-24 LAB — CBC WITH DIFFERENTIAL/PLATELET
Abs Immature Granulocytes: 0.46 10*3/uL — ABNORMAL HIGH (ref 0.00–0.07)
Basophils Absolute: 0 10*3/uL (ref 0.0–0.1)
Basophils Relative: 0 %
Eosinophils Absolute: 0.1 10*3/uL (ref 0.0–0.5)
Eosinophils Relative: 1 %
HCT: 24.4 % — ABNORMAL LOW (ref 36.0–46.0)
Hemoglobin: 7.9 g/dL — ABNORMAL LOW (ref 12.0–15.0)
Immature Granulocytes: 2 %
Lymphocytes Relative: 10 %
Lymphs Abs: 1.9 10*3/uL (ref 0.7–4.0)
MCH: 25.8 pg — ABNORMAL LOW (ref 26.0–34.0)
MCHC: 32.4 g/dL (ref 30.0–36.0)
MCV: 79.7 fL — ABNORMAL LOW (ref 80.0–100.0)
Monocytes Absolute: 1.4 10*3/uL — ABNORMAL HIGH (ref 0.1–1.0)
Monocytes Relative: 7 %
Neutro Abs: 15.3 10*3/uL — ABNORMAL HIGH (ref 1.7–7.7)
Neutrophils Relative %: 80 %
Platelets: 312 10*3/uL (ref 150–400)
RBC: 3.06 MIL/uL — ABNORMAL LOW (ref 3.87–5.11)
RDW: 18.2 % — ABNORMAL HIGH (ref 11.5–15.5)
WBC: 19.2 10*3/uL — ABNORMAL HIGH (ref 4.0–10.5)
nRBC: 0 % (ref 0.0–0.2)

## 2020-05-24 MED ORDER — METRONIDAZOLE 500 MG PO TABS: 500.0000 mg | ORAL_TABLET | Freq: Two times a day (BID) | ORAL | 0 refills | Status: AC

## 2020-05-24 MED ORDER — DOXYCYCLINE HYCLATE 100 MG PO TABS: 100.0000 mg | ORAL_TABLET | Freq: Two times a day (BID) | ORAL | Status: DC

## 2020-05-24 MED ORDER — DOCUSATE SODIUM 100 MG PO CAPS: 100.0000 mg | ORAL_CAPSULE | Freq: Every day | ORAL | 0 refills | Status: AC | PRN

## 2020-05-24 MED ORDER — DOXYCYCLINE HYCLATE 100 MG PO TABS: 100.0000 mg | ORAL_TABLET | Freq: Two times a day (BID) | ORAL | 0 refills | Status: AC

## 2020-05-24 MED ORDER — IBUPROFEN 600 MG PO TABS: 600.0000 mg | ORAL_TABLET | Freq: Four times a day (QID) | ORAL | 0 refills | Status: AC | PRN

## 2020-05-24 MED FILL — DOXYCYCLINE HYCLATE 100 MG: 100 | 13 days supply | Qty: 26 | Fill #0

## 2020-05-24 MED FILL — DOCUSATE SODIUM 100 MG CAPS: 100 | 30 days supply | Qty: 30 | Fill #0

## 2020-05-24 MED FILL — IBUPROFEN 600 MG TABLET: 600 | 8 days supply | Qty: 30 | Fill #0

## 2020-05-24 MED FILL — metroNIDAZOLE 500 MG TABS: 500 | 13 days supply | Qty: 26 | Fill #0

## 2020-05-24 NOTE — Plan of Care (Signed)

## 2020-05-24 NOTE — Discharge Summary (Signed)
Physician Discharge Summary  Patient ID: Sherry Hayes MRN: [id number] DOB/AGE: 33-May-1987 33 y.o.  Admit date: 05/21/2020 Discharge date: 05/24/2020  Admission Diagnoses:  Discharge Diagnoses:  Active Problems:   Tubo-ovarian abscess   Pelvic inflammatory disease (PID)   PID (pelvic inflammatory disease)   Discharged Condition: stable  Hospital Course: 35yo female admitted for PID/TOA.  She was treated with IV antibiotics and fluids.  Noted significant improvement, once afebrile x 48hrs, she was discharged home in stable condition.  Plan for outpatient antibiotic treatment and follow up.  Consults: None  Significant Diagnostic Studies: labs:  CBC    Component Value Date/Time   WBC 19.2 (H) 05/24/2020 0307   RBC 3.06 (L) 05/24/2020 0307   HGB 7.9 (L) 05/24/2020 0307   HGB 11.3 01/28/2017 1010   HCT 24.4 (L) 05/24/2020 0307   HCT 34.3 01/28/2017 1010   PLT 312 05/24/2020 0307   PLT 374 01/28/2017 1010   MCV 79.7 (L) 05/24/2020 0307   MCV 81 01/28/2017 1010   MCH 25.8 (L) 05/24/2020 0307   MCHC 32.4 05/24/2020 0307   RDW 18.2 (H) 05/24/2020 0307   RDW 17.1 (H) 01/28/2017 1010   LYMPHSABS 1.9 05/24/2020 0307   MONOABS 1.4 (H) 05/24/2020 0307   EOSABS 0.1 05/24/2020 0307   BASOSABS 0.0 05/24/2020 0307    and3/28: Abd/Pelvic CT:  Reproductive: Uterus is well visualized within normal enhancement pattern. Adjacent to the uterus however are bilateral cystic changes in the ovaries with adjacent tubular fluid-filled structures right greater than left consistent with tubo-ovarian abscesses. Significant inflammatory changes noted particularly on the right which envelopment of the distal right ureter causing the more proximal obstructive change as well as some inflammatory change of surrounding the distal small bowel. There is a focal 6.2 x 3.3 cm hyperdense area adjacent to this best seen on image number 54 of series 3 and image number 50 of series 6 consistent with  phlegmonous change. This is new from the prior examination.   IMPRESSION: Changes consistent with pelvic inflammatory disease and bilateral tubo-ovarian abscesses right greater than left. Some associated phlegmon in the right lower quadrant is noted as well.  Normal-appearing appendix.  Dilatation of the right renal collecting system and right ureter secondary to distal inflammatory change related to the right ovary. This may correspond to the given clinical history of hematuria.  Treatments: IV hydration and antibiotics: metronidazole and Cefotetan, Doxycycline  Discharge Exam: Blood pressure 101/79, pulse 98, temperature (!) 97.3 F (36.3 C), temperature source Oral, resp. rate 19, height 5\' 4"  (1.626 m), weight 79.4 kg, last menstrual period 04/29/2020, SpO2 100 %, unknown if currently breastfeeding. General appearance: alert and no distress Resp: clear to auscultation bilaterally Cardio: regular rate and rhythm GI: soft, non-tender; bowel sounds normal; no masses,  no organomegaly Extremities: edema absent, no calf tenderness bilaterally Skin: Skin color, texture, turgor normal. No rashes or lesions  Disposition: Discharge disposition: 01-Home or Self Care       Discharge Instructions    Activity as tolerated   Complete by: As directed    Call MD for:  persistant dizziness or light-headedness   Complete by: As directed    Call MD for:  persistant nausea and vomiting   Complete by: As directed    Call MD for:  redness, tenderness, or signs of infection (pain, swelling, redness, odor or green/yellow discharge around incision site)   Complete by: As directed    Call MD for:  severe uncontrolled pain  Complete by: As directed    Diet general   Complete by: As directed    Sexual activity   Complete by: As directed    Would advise avoiding sex for the next 2-3 weeks     Allergies as of 05/24/2020      Reactions   Latex       Medication List    TAKE these  medications   acetaminophen 500 MG tablet Commonly known as: TYLENOL Take 1,000 mg by mouth every 6 (six) hours as needed for mild pain or moderate pain.   docusate sodium 100 MG capsule Commonly known as: COLACE Take 1 capsule (100 mg total) by mouth daily as needed for mild constipation or moderate constipation.   doxycycline 100 MG tablet Commonly known as: VIBRA-TABS Take 1 tablet (100 mg total) by mouth every 12 (twelve) hours for 13 days.   ibuprofen 600 MG tablet Commonly known as: ADVIL Take 1 tablet (600 mg total) by mouth every 6 (six) hours as needed for mild pain or moderate pain.   metroNIDAZOLE 500 MG tablet Commonly known as: FLAGYL Take 1 tablet (500 mg total) by mouth every 12 (twelve) hours for 13 days.   predniSONE 20 MG tablet Commonly known as: DELTASONE Two daily with food   vitamin C 500 MG tablet Commonly known as: ASCORBIC ACID Take 500 mg by mouth daily.   Vitamin D-3 25 MCG (1000 UT) Caps Take by mouth.       Follow-up Information    [facility] In 2 weeks.   Specialty: Obstetrics and Gynecology Why: Please make a f/u appointment in 1-2wks Contact information: [address] [place] [address] [phone]              Signed: Sharon Seller 05/24/2020, 8:37 AM

## 2020-05-25 ENCOUNTER — Telehealth: Payer: Self-pay | Admitting: *Deleted

## 2020-05-25 NOTE — Telephone Encounter (Signed)
Transition Care Management Follow-up Telephone Call  Date of discharge and from where: 05/24/2020 [facility]  How have you been since you were released from the hospital? "I am getting by"  Any questions or concerns? No  Items Reviewed:  Did the pt receive and understand the discharge instructions provided? Yes   Medications obtained and verified? Yes   Other? No   Any new allergies since your discharge? No   Dietary orders reviewed? No  Do you have support at home? Yes    Functional Questionnaire: (I = Independent and D = Dependent) ADLs: I  Bathing/Dressing- I  Meal Prep- I  Eating- I  Maintaining continence- I  Transferring/Ambulation- I  Managing Meds- I  Follow up appointments reviewed:   PCP Hospital f/u appt confirmed? No    Specialist Hospital f/u appt confirmed? No    Are transportation arrangements needed? No   If their condition worsens, is the pt aware to call PCP or go to the Emergency Dept.? Yes  Was the patient provided with contact information for the PCP's office or ED? Yes  Was to pt encouraged to call back with questions or concerns? Yes

## 2020-09-12 ENCOUNTER — Ambulatory Visit (INDEPENDENT_AMBULATORY_CARE_PROVIDER_SITE_OTHER): Payer: Medicaid Other | Admitting: *Deleted

## 2020-09-12 ENCOUNTER — Other Ambulatory Visit (HOSPITAL_COMMUNITY)
Admission: RE | Admit: 2020-09-12 | Discharge: 2020-09-12 | Disposition: A | Discharge status: Home / Self Care | Payer: Medicaid Other | Source: Ambulatory Visit | Attending: Family Medicine | Admitting: Family Medicine

## 2020-09-12 ENCOUNTER — Other Ambulatory Visit: Payer: Self-pay

## 2020-09-12 VITALS — BP 140/80 | HR 79 | Ht 64.0 in | Wt 170.5 lb

## 2020-09-12 DIAGNOSIS — N898 Other specified noninflammatory disorders of vagina: Secondary | ICD-10-CM | POA: Insufficient documentation

## 2020-09-12 DIAGNOSIS — Z3200 Encounter for pregnancy test, result unknown: Secondary | ICD-10-CM

## 2020-09-12 DIAGNOSIS — Z3202 Encounter for pregnancy test, result negative: Secondary | ICD-10-CM | POA: Diagnosis not present

## 2020-09-12 LAB — POCT URINALYSIS DIP (DEVICE)
Bilirubin Urine: NEGATIVE
Glucose, UA: NEGATIVE mg/dL
Hgb urine dipstick: NEGATIVE
Ketones, ur: NEGATIVE mg/dL
Leukocytes,Ua: NEGATIVE
Nitrite: NEGATIVE
Protein, ur: NEGATIVE mg/dL
Specific Gravity, Urine: 1.025 (ref 1.005–1.030)
Urobilinogen, UA: 0.2 mg/dL (ref 0.0–1.0)
pH: 5.5 (ref 5.0–8.0)

## 2020-09-12 LAB — POCT PREGNANCY, URINE: Preg Test, Ur: NEGATIVE

## 2020-09-12 NOTE — Progress Notes (Signed)
Chart reviewed for nurse visit. Agree with plan of care.   Venora Maples, MD 09/12/20 1:12 PM

## 2020-09-12 NOTE — Progress Notes (Signed)
Here for nurse visit for upt - which was negative and UA which was negative. States period ended early -usually 7 days, but last period was only 2 days. States LMP 7/12-7/14/22  and did home pregnancy test which was positive on 08/29/20. . States she is having signs of pregnancy including nausea and breast tenderness. Offered to do blood test , but will have to wait until lab tech back from lunch . She elects to either do at her scheduled gyn visit or come back next week. She also c/o increased vaginal discharge and would like to check. Self swab completed. [place] Sigmund,RN

## 2020-09-13 ENCOUNTER — Other Ambulatory Visit: Payer: Self-pay | Admitting: Family Medicine

## 2020-09-13 LAB — CERVICOVAGINAL ANCILLARY ONLY
Bacterial Vaginitis (gardnerella): POSITIVE — AB
Candida Glabrata: NEGATIVE
Candida Vaginitis: NEGATIVE
Chlamydia: NEGATIVE
Comment: NEGATIVE
Comment: NEGATIVE
Comment: NEGATIVE
Comment: NEGATIVE
Comment: NEGATIVE
Comment: NORMAL
Neisseria Gonorrhea: NEGATIVE
Trichomonas: NEGATIVE

## 2020-09-13 MED ORDER — METRONIDAZOLE 500 MG PO TABS: 500.0000 mg | ORAL_TABLET | Freq: Two times a day (BID) | ORAL | 0 refills | Status: AC

## 2020-09-26 ENCOUNTER — Telehealth: Payer: Self-pay | Admitting: Family Medicine

## 2020-09-26 ENCOUNTER — Ambulatory Visit: Payer: Medicaid Other | Admitting: Nurse Practitioner

## 2020-09-26 ENCOUNTER — Other Ambulatory Visit: Payer: Self-pay | Admitting: Family Medicine

## 2020-09-26 NOTE — Telephone Encounter (Signed)
Called patient and she states she had a period earlier this month 7/7-7/9 for 2 days that was lighter than normal. Patient states she normally gets her period the 8th of the month. Patient states she got her period again 7/29 and is still on. Patient is worried something might be wrong. Discussed with patient sometimes our periods can be off and we don't exactly know why. Told patient it usually isn't worrisome and she should monitor future cycles and if it continues, to make an appt in our office. Patient verbalized understanding and asked if BV could cause this and I told the patient no. Patient verbalized understanding and will continue to monitor her cycles.

## 2020-09-26 NOTE — Telephone Encounter (Signed)
Patient would like a call, she have questions about why she keep having multiple cycles.

## 2020-10-19 ENCOUNTER — Ambulatory Visit: Payer: Medicaid Other | Admitting: Obstetrics and Gynecology

## 2020-11-20 ENCOUNTER — Other Ambulatory Visit: Payer: Self-pay

## 2022-08-23 ENCOUNTER — Other Ambulatory Visit: Payer: Medicaid Other

## 2022-11-13 IMAGING — CR DG HIP (WITH OR WITHOUT PELVIS) 3-4V BILAT
5 series · 5 of 5 positions shown · non-contrast
Comparison: None.

CLINICAL DATA: Hematuria following fall, initial encounter

EXAM:
DG HIP (WITH OR WITHOUT PELVIS) 4V BILAT

[t pelvis ap]
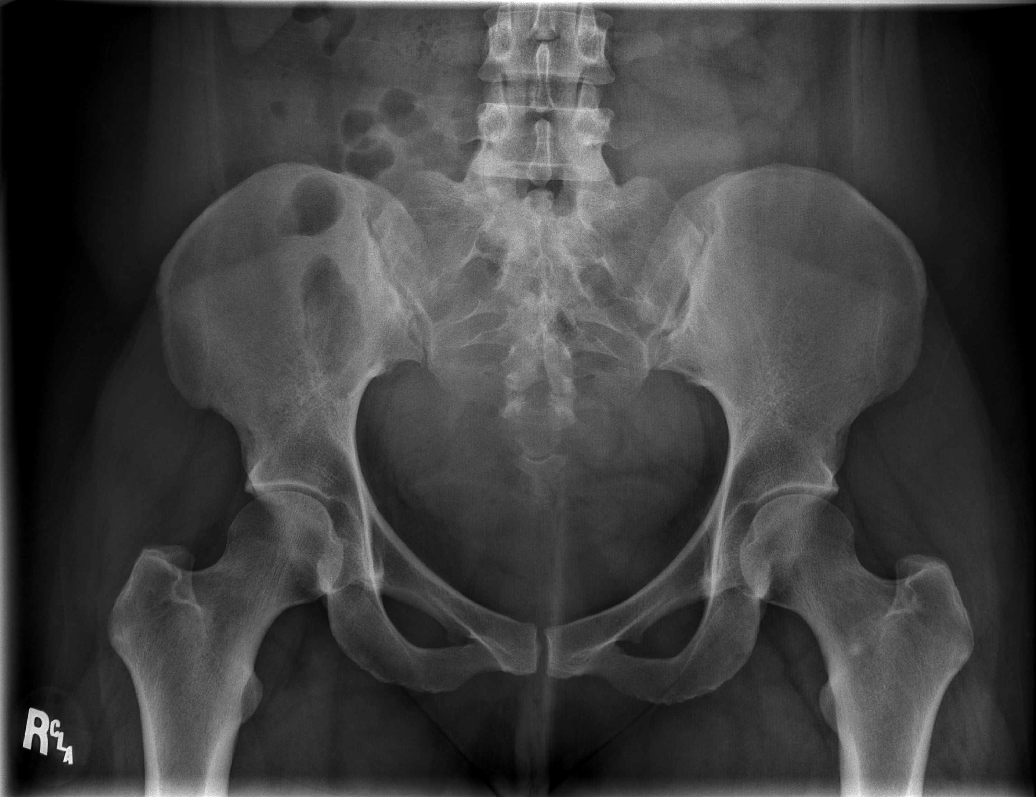

[t hip ap right]
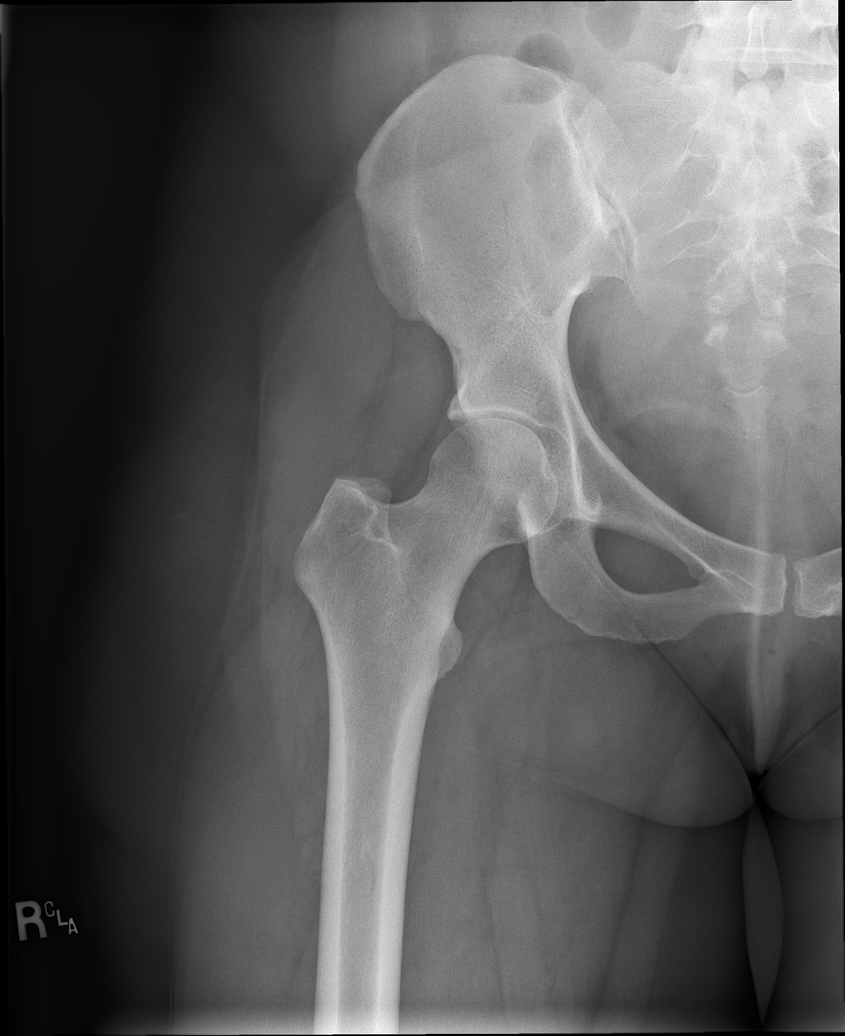

[t hip frog leg right]
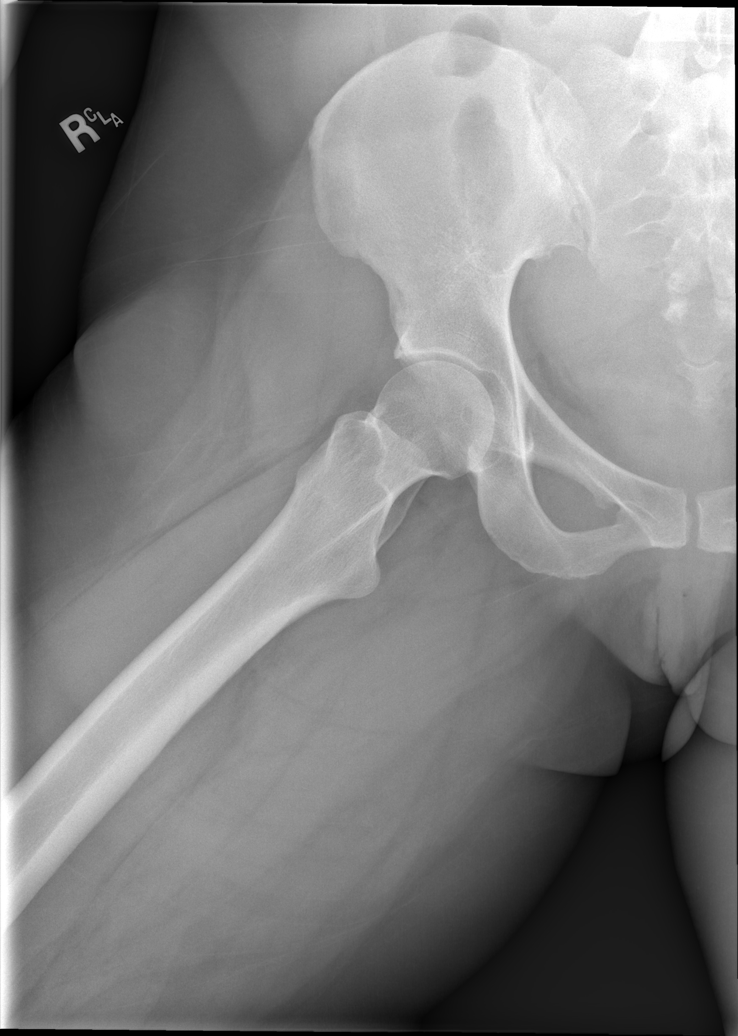

[t hip ap left]
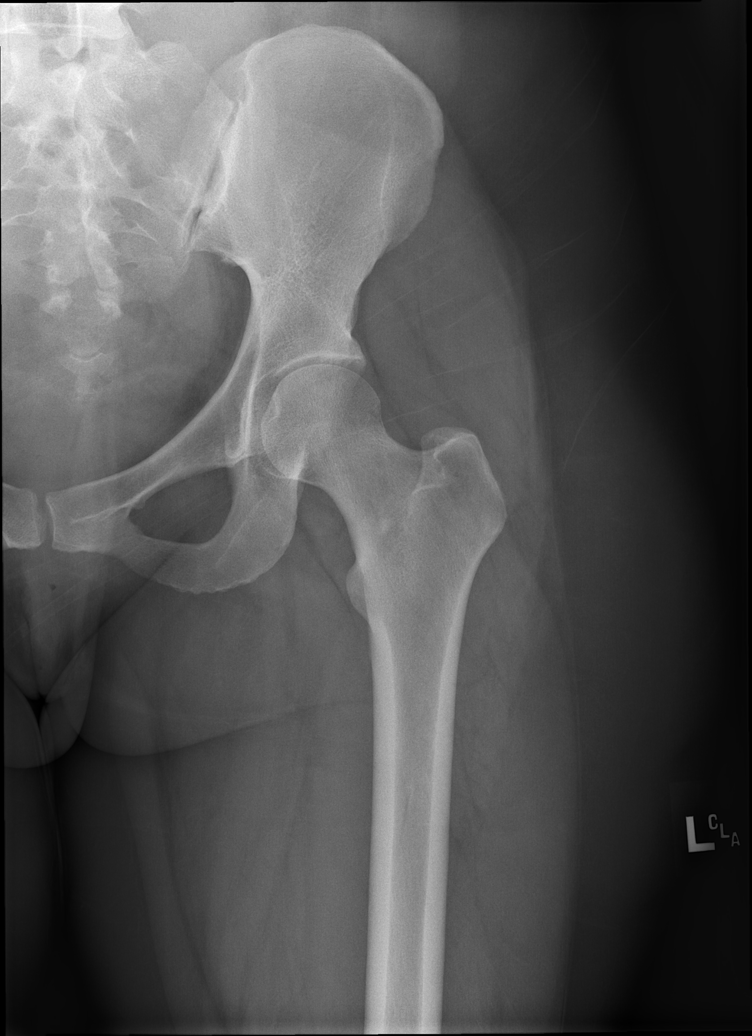

[t hip frog leg left]
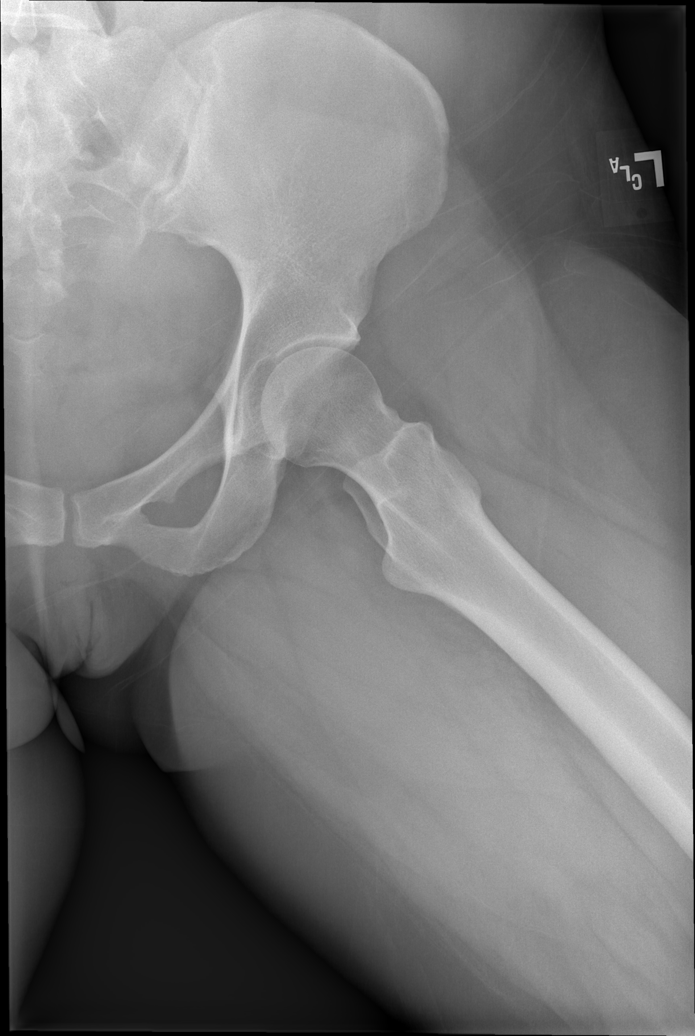

[5 of 5 positions shown; findings below may reference images not displayed]

FINDINGS: Pelvic ring is intact. No acute fracture or dislocation is noted. No
soft tissue abnormality is seen.
IMPRESSION: No acute abnormality noted.
